# Patient Record
Sex: Male | Born: 1955 | Race: White | Hispanic: No | Marital: Single | Smoking: Current every day smoker
Health system: Southern US, Community
[De-identification: ages and names within clinical notes are randomized; demographics above are authoritative.]

## PROBLEM LIST (undated history)

## (undated) DIAGNOSIS — I714 Abdominal aortic aneurysm, without rupture, unspecified: Secondary | ICD-10-CM

## (undated) DIAGNOSIS — R519 Headache, unspecified: Secondary | ICD-10-CM

## (undated) DIAGNOSIS — F99 Mental disorder, not otherwise specified: Secondary | ICD-10-CM

## (undated) DIAGNOSIS — I951 Orthostatic hypotension: Secondary | ICD-10-CM

## (undated) DIAGNOSIS — U071 COVID-19: Secondary | ICD-10-CM

## (undated) DIAGNOSIS — R0600 Dyspnea, unspecified: Secondary | ICD-10-CM

## (undated) DIAGNOSIS — F419 Anxiety disorder, unspecified: Secondary | ICD-10-CM

## (undated) DIAGNOSIS — F209 Schizophrenia, unspecified: Secondary | ICD-10-CM

## (undated) HISTORY — DX: Abdominal aortic aneurysm, without rupture: I71.4

## (undated) HISTORY — PX: FINGER SURGERY: SHX640

## (undated) HISTORY — DX: Abdominal aortic aneurysm, without rupture, unspecified: I71.40

## (undated) HISTORY — DX: Anxiety disorder, unspecified: F41.9

---

## 1999-01-09 ENCOUNTER — Encounter: Payer: Self-pay | Admitting: Family Medicine

## 1999-01-09 ENCOUNTER — Ambulatory Visit (HOSPITAL_COMMUNITY): Admission: RE | Admit: 1999-01-09 | Discharge: 1999-01-09 | Payer: Self-pay | Admitting: Family Medicine

## 2007-02-16 ENCOUNTER — Emergency Department (HOSPITAL_COMMUNITY): Admission: EM | Admit: 2007-02-16 | Discharge: 2007-02-16 | Payer: Self-pay | Admitting: Emergency Medicine

## 2007-02-18 ENCOUNTER — Emergency Department (HOSPITAL_COMMUNITY): Admission: EM | Admit: 2007-02-18 | Discharge: 2007-02-18 | Payer: Self-pay | Admitting: Family Medicine

## 2007-02-21 ENCOUNTER — Emergency Department (HOSPITAL_COMMUNITY): Admission: EM | Admit: 2007-02-21 | Discharge: 2007-02-21 | Payer: Self-pay | Admitting: Family Medicine

## 2007-02-23 ENCOUNTER — Emergency Department (HOSPITAL_COMMUNITY): Admission: EM | Admit: 2007-02-23 | Discharge: 2007-02-23 | Payer: Self-pay | Admitting: Family Medicine

## 2007-02-26 ENCOUNTER — Emergency Department (HOSPITAL_COMMUNITY): Admission: EM | Admit: 2007-02-26 | Discharge: 2007-02-26 | Payer: Self-pay | Admitting: Family Medicine

## 2008-02-01 ENCOUNTER — Emergency Department (HOSPITAL_COMMUNITY): Admission: EM | Admit: 2008-02-01 | Discharge: 2008-02-01 | Payer: Self-pay | Admitting: Emergency Medicine

## 2008-02-14 ENCOUNTER — Emergency Department (HOSPITAL_COMMUNITY): Admission: EM | Admit: 2008-02-14 | Discharge: 2008-02-14 | Payer: Self-pay | Admitting: Family Medicine

## 2008-06-06 ENCOUNTER — Emergency Department (HOSPITAL_COMMUNITY): Admission: EM | Admit: 2008-06-06 | Discharge: 2008-06-06 | Payer: Self-pay | Admitting: Emergency Medicine

## 2008-06-17 ENCOUNTER — Emergency Department (HOSPITAL_COMMUNITY): Admission: EM | Admit: 2008-06-17 | Discharge: 2008-06-17 | Payer: Self-pay | Admitting: Emergency Medicine

## 2010-03-09 ENCOUNTER — Emergency Department (HOSPITAL_COMMUNITY): Admission: EM | Admit: 2010-03-09 | Discharge: 2010-03-09 | Payer: Self-pay | Admitting: Emergency Medicine

## 2010-05-05 ENCOUNTER — Emergency Department (HOSPITAL_COMMUNITY): Admission: EM | Admit: 2010-05-05 | Discharge: 2010-05-05 | Payer: Self-pay | Admitting: Family Medicine

## 2010-11-02 ENCOUNTER — Emergency Department (HOSPITAL_COMMUNITY): Admission: EM | Admit: 2010-11-02 | Discharge: 2010-11-02 | Payer: Self-pay | Admitting: Emergency Medicine

## 2011-02-17 LAB — CULTURE, ROUTINE-ABSCESS: Culture: NO GROWTH

## 2011-10-13 ENCOUNTER — Emergency Department (INDEPENDENT_AMBULATORY_CARE_PROVIDER_SITE_OTHER)
Admission: EM | Admit: 2011-10-13 | Discharge: 2011-10-13 | Disposition: A | Discharge status: Home / Self Care | Payer: Medicare Other | Source: Home / Self Care

## 2011-10-13 DIAGNOSIS — J209 Acute bronchitis, unspecified: Secondary | ICD-10-CM

## 2011-10-13 HISTORY — DX: Mental disorder, not otherwise specified: F99

## 2011-10-13 MED ORDER — DOXYCYCLINE HYCLATE 100 MG PO CAPS: 100.0000 mg | ORAL_CAPSULE | Freq: Two times a day (BID) | ORAL | Status: AC

## 2011-10-13 NOTE — ED Notes (Signed)
C/o 2 week duration of cold related syx, no relief w ASA and fluphenazine

## 2011-10-13 NOTE — ED Provider Notes (Signed)
History     CSN: [id number] Arrival date & time: 10/13/2011  4:20 PM   None     Chief Complaint  Patient presents with  . URI    (Consider location/radiation/quality/duration/timing/severity/associated sxs/prior treatment) Patient is a 55 y.o. male presenting with cough. The history is provided by the patient.  Cough This is a new problem. Episode onset: 2 weeks. The problem has been gradually worsening. The cough is productive of sputum (thick yellow phlegm). There has been no fever. Associated symptoms include rhinorrhea (only when walking outside at night in the cold air). Pertinent negatives include no chest pain, no chills, no ear congestion, no ear pain, no headaches, no sore throat, no shortness of breath and no wheezing. He has tried nothing for the symptoms. He is a smoker.    Past Medical History  Diagnosis Date  . Psychiatric diagnosis     History reviewed. No pertinent past surgical history.  History reviewed. No pertinent family history.  History  Substance Use Topics  . Smoking status: Current Everyday Smoker -- 2.0 packs/day  . Smokeless tobacco: Not on file  . Alcohol Use: Yes      Review of Systems  Constitutional: Negative for fever, chills and fatigue.  HENT: Positive for rhinorrhea (only when walking outside at night in the cold air). Negative for ear pain, congestion, sore throat, sneezing, postnasal drip and sinus pressure.   Respiratory: Positive for cough. Negative for shortness of breath and wheezing.   Cardiovascular: Negative for chest pain and palpitations.  Neurological: Negative for headaches.    Allergies  Review of patient's allergies indicates no known allergies.  Home Medications   Current Outpatient Rx  Name Route Sig Dispense Refill  . FLUPHENAZINE HCL 10 MG PO TABS Oral Take 10 mg by mouth daily.      Marland Kitchen DOXYCYCLINE HYCLATE 100 MG PO CAPS Oral Take 1 capsule (100 mg total) by mouth 2 (two) times daily. 20 capsule 0    BP  100/65  Pulse 87  Temp(Src) 98.5 F (36.9 C) (Oral)  Resp 20  SpO2 100%  Physical Exam  Nursing note and vitals reviewed. Constitutional: He appears well-developed and well-nourished. No distress.  HENT:  Head: Normocephalic and atraumatic.  Right Ear: Tympanic membrane, external ear and ear canal normal.  Left Ear: Tympanic membrane, external ear and ear canal normal.  Nose: Nose normal.  Mouth/Throat: Uvula is midline, oropharynx is clear and moist and mucous membranes are normal. No oropharyngeal exudate, posterior oropharyngeal edema or posterior oropharyngeal erythema.  Neck: Neck supple.  Cardiovascular: Normal rate, regular rhythm and normal heart sounds.   Pulmonary/Chest: Effort normal and breath sounds normal. No respiratory distress.  Lymphadenopathy:    He has no cervical adenopathy.  Neurological: He is alert.  Skin: Skin is warm and dry.  Psychiatric: He has a normal mood and affect.    ED Course  Procedures (including critical care time)  Labs Reviewed - No data to display No results found.   1. Bronchitis, acute       MDM           Melody Comas, PA 10/13/11 1740

## 2011-10-13 NOTE — ED Notes (Signed)
Called rx for vibramycin 100mg  Po , BID x 10 days, QS, NR at ptp requesst to [facility] in [place] , [place] ; spoke directly c pharmacist

## 2011-12-10 ENCOUNTER — Emergency Department (INDEPENDENT_AMBULATORY_CARE_PROVIDER_SITE_OTHER)
Admission: EM | Admit: 2011-12-10 | Discharge: 2011-12-10 | Disposition: A | Discharge status: Home / Self Care | Payer: Medicare Other | Source: Home / Self Care | Attending: Emergency Medicine | Admitting: Emergency Medicine

## 2011-12-10 ENCOUNTER — Encounter (HOSPITAL_COMMUNITY): Payer: Self-pay

## 2011-12-10 DIAGNOSIS — R21 Rash and other nonspecific skin eruption: Secondary | ICD-10-CM

## 2011-12-10 HISTORY — DX: Schizophrenia, unspecified: F20.9

## 2011-12-10 MED ORDER — TRIAMCINOLONE ACETONIDE 0.1 % EX CREA: TOPICAL_CREAM | Freq: Two times a day (BID) | CUTANEOUS | Status: AC

## 2011-12-10 MED ORDER — HYDROXYZINE HCL 25 MG PO TABS: 25.0000 mg | ORAL_TABLET | Freq: Four times a day (QID) | ORAL | Status: AC

## 2011-12-10 NOTE — ED Provider Notes (Signed)
History     CSN: [id number]  Arrival date & time 12/10/11  1220   First MD Initiated Contact with Patient 12/10/11 1230      Chief Complaint  Patient presents with  . Rash    (Consider location/radiation/quality/duration/timing/severity/associated sxs/prior treatment) HPI Comments: Have had this rash, itchy for 2 weeks, its drying up some now, but still itching , some patches on my upper back are gone now"  Patient is a 56 y.o. male presenting with rash. The history is provided by the patient.  Rash  This is a new problem. Episode onset: 2 weeks. The problem has been gradually improving. There has been no fever. The rash is present on the torso. The pain is at a severity of 1/10. The patient is experiencing no pain. The pain has been constant since onset. Associated symptoms include itching. Pertinent negatives include no blisters and no weeping. He has tried nothing for the symptoms. The treatment provided no relief.    Past Medical History  Diagnosis Date  . Psychiatric diagnosis   . Schizophrenia     History reviewed. No pertinent past surgical history.  No family history on file.  History  Substance Use Topics  . Smoking status: Current Everyday Smoker -- 2.0 packs/day  . Smokeless tobacco: Not on file  . Alcohol Use: Yes     occasional      Review of Systems  Constitutional: Negative for fever, chills and fatigue.  Musculoskeletal: Negative for myalgias and joint swelling.  Skin: Positive for itching and rash.    Allergies  Review of patient's allergies indicates no known allergies.  Home Medications   Current Outpatient Rx  Name Route Sig Dispense Refill  . FLUPHENAZINE HCL 10 MG PO TABS Oral Take 10 mg by mouth daily.      Marland Kitchen HYDROXYZINE HCL 25 MG PO TABS Oral Take 1 tablet (25 mg total) by mouth every 6 (six) hours. 12 tablet 0  . TRIAMCINOLONE ACETONIDE 0.1 % EX CREA Topical Apply topically 2 (two) times daily. Apply twice a day for x 7 days 30 g 0     BP 108/73  Pulse 74  Temp(Src) 97.7 F (36.5 C) (Oral)  Resp 18  SpO2 97%  Physical Exam  Nursing note and vitals reviewed. Constitutional: He appears well-nourished. No distress.  HENT:  Head: Normocephalic.  Pulmonary/Chest:    Skin: Rash noted. Rash is papular. There is erythema.       ED Course  Procedures (including critical care time)  Labs Reviewed - No data to display No results found.   1. Papular eruption       MDM  Papular eruption Let upper Chest wall and shoulder are- resembles dermatome distribution- 40 weeks old- no recent new patches- drying up at this point (have not received Zoster vaccine)        Jimmie Molly, MD 12/10/11 1404

## 2011-12-10 NOTE — ED Notes (Signed)
C/o itchy rash to lt upper chest for 2 weeks.  Denies pain.  States putting tinactin cream on it without improvement. Denies pain or burning

## 2012-10-14 ENCOUNTER — Encounter (HOSPITAL_COMMUNITY): Payer: Self-pay | Admitting: Emergency Medicine

## 2012-10-14 ENCOUNTER — Emergency Department (INDEPENDENT_AMBULATORY_CARE_PROVIDER_SITE_OTHER)
Admission: EM | Admit: 2012-10-14 | Discharge: 2012-10-14 | Disposition: A | Discharge status: Home / Self Care | Payer: Medicare Other | Source: Home / Self Care | Attending: Emergency Medicine | Admitting: Emergency Medicine

## 2012-10-14 DIAGNOSIS — J069 Acute upper respiratory infection, unspecified: Secondary | ICD-10-CM

## 2012-10-14 MED ORDER — SALINE NASAL SPRAY 0.65 % NA SOLN: 1.0000 | NASAL | Status: DC | PRN

## 2012-10-14 MED ORDER — CETIRIZINE-PSEUDOEPHEDRINE ER 5-120 MG PO TB12: 1.0000 | ORAL_TABLET | Freq: Two times a day (BID) | ORAL | Status: DC

## 2012-10-14 NOTE — ED Provider Notes (Signed)
History     CSN: [id number]  Arrival date & time 10/14/12  1343   First MD Initiated Contact with Patient 10/14/12 1624      Chief Complaint  Patient presents with  . URI    (Consider location/radiation/quality/duration/timing/severity/associated sxs/prior treatment) Patient is a 56 y.o. male presenting with URI. The history is provided by the patient.  URI The primary symptoms include headaches, sore throat and cough. The current episode started yesterday. This is a new problem. The problem has been gradually worsening.  The onset of the illness is associated with exposure to sick contacts. Symptoms associated with the illness include plugged ear sensation, sinus pressure, congestion and rhinorrhea. The illness is not associated with chills or facial pain. The following treatments were addressed: Acetaminophen was not tried. A decongestant was not tried. Aspirin was not tried. NSAIDs were not tried.    Past Medical History  Diagnosis Date  . Psychiatric diagnosis   . Schizophrenia     History reviewed. No pertinent past surgical history.  No family history on file.  History  Substance Use Topics  . Smoking status: Current Every Day Smoker -- 2.0 packs/day  . Smokeless tobacco: Not on file  . Alcohol Use: Yes     Comment: occasional      Review of Systems  Constitutional: Negative for chills.  HENT: Positive for congestion, sore throat, rhinorrhea and sinus pressure.   Respiratory: Positive for cough.   Neurological: Positive for headaches.  All other systems reviewed and are negative.    Allergies  Review of patient's allergies indicates no known allergies.  Home Medications   Current Outpatient Rx  Name  Route  Sig  Dispense  Refill  . CETIRIZINE-PSEUDOEPHEDRINE ER 5-120 MG PO TB12   Oral   Take 1 tablet by mouth 2 (two) times daily.   60 tablet   0   . FLUPHENAZINE HCL 10 MG PO TABS   Oral   Take 10 mg by mouth daily.           Marland Kitchen SALINE NASAL  SPRAY 0.65 % NA SOLN   Nasal   Place 1 spray into the nose as needed for congestion.   30 mL   12   . TRIAMCINOLONE ACETONIDE 0.1 % EX CREA   Topical   Apply topically 2 (two) times daily. Apply twice a day for x 7 days   30 g   0     BP 100/69  Pulse 78  Temp 98.1 F (36.7 C) (Oral)  Resp 16  SpO2 100%  Physical Exam  Nursing note and vitals reviewed. Constitutional: He is oriented to person, place, and time. Vital signs are normal. He appears well-developed and well-nourished. He is active and cooperative.  HENT:  Head: Normocephalic.  Right Ear: External ear normal.  Left Ear: External ear normal.  Nose: Nose normal.  Mouth/Throat: Oropharynx is clear and moist. No oropharyngeal exudate.  Eyes: Conjunctivae normal are normal. Pupils are equal, round, and reactive to light. No scleral icterus.  Neck: Trachea normal and normal range of motion. Neck supple.  Cardiovascular: Normal rate, regular rhythm, normal heart sounds and intact distal pulses.   Pulmonary/Chest: Effort normal and breath sounds normal. No respiratory distress.  Lymphadenopathy:    He has no cervical adenopathy.  Neurological: He is alert and oriented to person, place, and time. No cranial nerve deficit or sensory deficit.  Skin: Skin is warm and dry.  Psychiatric: He has a normal mood and affect.  His speech is normal and behavior is normal. Judgment and thought content normal. Cognition and memory are normal.    ED Course  Procedures (including critical care time)  Labs Reviewed - No data to display No results found.   1. URI (upper respiratory infection)       MDM  Increase fluids, rest, medications as prescribed.       Johnsie Kindred, NP 10/14/12 1635

## 2012-10-14 NOTE — ED Notes (Signed)
Patient requested medicines be called in to a particular pharmacy.  Attempted to call in medicines-patient's preferred pharmacy.  This pharmacy was closed-closed at 3 pm on saturdays.  Patient accepted hard copy scripts, declined to have scripts called in to any other pharmacy.

## 2012-10-14 NOTE — ED Notes (Signed)
Reports stuffy head that started yesterday.  Patient reports this usually leads to chest within a week.  No fever, slight headache, throat was a little sore initially

## 2012-10-14 NOTE — ED Provider Notes (Signed)
Medical screening examination/treatment/procedure(s) were performed by non-physician practitioner and as supervising physician I was immediately available for consultation/collaboration.  Leslee Home, M.D.   Reuben Likes, MD 10/14/12 2230

## 2014-04-19 ENCOUNTER — Ambulatory Visit (INDEPENDENT_AMBULATORY_CARE_PROVIDER_SITE_OTHER): Payer: PRIVATE HEALTH INSURANCE | Admitting: Family Medicine

## 2014-04-19 VITALS — BP 124/76 | HR 81 | Temp 98.2°F | Resp 17 | Ht 71.0 in | Wt 145.0 lb

## 2014-04-19 DIAGNOSIS — Z0000 Encounter for general adult medical examination without abnormal findings: Secondary | ICD-10-CM

## 2014-04-19 DIAGNOSIS — F200 Paranoid schizophrenia: Secondary | ICD-10-CM

## 2014-04-19 NOTE — Patient Instructions (Signed)
Good to see you today.  Please continue to follow-up with your regular doctor as usual.  If there is any problem with your forms let us or Dr. Concepcion Elk know.

## 2014-04-19 NOTE — Progress Notes (Signed)
[facility] [address], [place] [address] [phone]  Date:  04/19/2014   Name:  Sean Lowe   DOB:  [birth year]   MRN:  [id number]  PCP:  Dorrene German, MD    Chief Complaint: Annual Exam   History of Present Illness:  Sean Lowe is a 58 y.o. very pleasant male patient who presents with the following:  Here today as a new patient seeking a CPE/ forms for medicaid.  His PCP is at the [facility].    Needs an FL-2 form for his group home/ medicaid serivces.  He resides with [facility] and the staff has already completed his form  He was not able to get in with his PCP today- Dr. Concepcion Elk usually fills out his FL-2.  he does not need anything for me today such as labs or refills  There are no active problems to display for this patient.   Past Medical History  Diagnosis Date  . Psychiatric diagnosis   . Schizophrenia   . Anxiety     History reviewed. No pertinent past surgical history.  History  Substance Use Topics  . Smoking status: Current Every Day Smoker -- 2.00 packs/day for 42 years  . Smokeless tobacco: Not on file  . Alcohol Use: Yes     Comment: occasional    History reviewed. No pertinent family history.  No Known Allergies  Medication list has been reviewed and updated.  Current Outpatient Prescriptions on File Prior to Visit  Medication Sig Dispense Refill  . fluPHENAZine (PROLIXIN) 10 MG tablet Take 10 mg by mouth daily.         No current facility-administered medications on file prior to visit.    Review of Systems:  As per HPI- otherwise negative.   Physical Examination: Filed Vitals:   04/19/14 1255  BP: 124/76  Pulse: 81  Temp: 98.2 F (36.8 C)  Resp: 17   Filed Vitals:   04/19/14 1255  Height: 5\' 11"  (1.803 m)  Weight: 145 lb (65.772 kg)   Body mass index is 20.23 kg/(m^2). Ideal Body Weight: Weight in (lb) to have BMI = 25: 178.9  GEN: WDWN, NAD, Non-toxic, A & O x 3, normal  weight HEENT: Atraumatic, Normocephalic. Neck supple. No masses, No LAD. Ears and Nose: No external deformity. CV: RRR, No M/G/Sean. No JVD. No thrill. No extra heart sounds. PULM: CTA B, no wheezes, crackles, rhonchi. No retractions. No resp. distress. No accessory muscle use. ABD: S, NT, ND, +BS. No rebound. No HSM. EXTR: No c/c/e NEURO Normal gait.  PSYCH: Normally interactive. Conversant. No evidence of psychosis    Assessment and Plan: Physical exam  PE to complete FL-2 form today.  He will let me know if we can do anything else to help   Signed Abbe Amsterdam, MD

## 2015-05-28 ENCOUNTER — Ambulatory Visit (INDEPENDENT_AMBULATORY_CARE_PROVIDER_SITE_OTHER): Payer: Medicaid Other | Admitting: Family Medicine

## 2015-05-28 VITALS — BP 108/56 | HR 78 | Temp 98.1°F | Resp 14 | Ht 71.0 in | Wt 142.6 lb

## 2015-05-28 DIAGNOSIS — F200 Paranoid schizophrenia: Secondary | ICD-10-CM

## 2015-05-28 DIAGNOSIS — Z720 Tobacco use: Secondary | ICD-10-CM

## 2015-05-28 NOTE — Patient Instructions (Signed)
Good to see you today- take care.  Let me know if any problems with your forms

## 2015-05-28 NOTE — Progress Notes (Signed)
[facility] [address], [place] [id number] [phone]  Date:  05/28/2015   Name:  Sean CahillR GLENN Lowe   DOB:  Jul 31, 1956   MRN:  [id number]  PCP:  Dorrene GermanAVBUERE,EDWIN A, MD    Chief Complaint: Forms   History of Present Illness:  Sean Lowe is a 59 y.o. very pleasant male patient who presents with the following:  [facility] and needs his FL-2 form completed  He has schizophrenia but no other medical problems.   He is still on the same medication. He gets his mental health services from [facility]- his treating psychiatrist is variable He does have a PCP but was not able to get in with him for these forms   He takes prolixin total of 7.5 mg daily for his mental illness, also vitamin D and prn tylenol   Patient Active Problem List   Diagnosis Date Noted  . Paranoid schizophrenia 04/19/2014    Past Medical History  Diagnosis Date  . Psychiatric diagnosis   . Schizophrenia   . Anxiety     History reviewed. No pertinent past surgical history.  History  Substance Use Topics  . Smoking status: Current Every Day Smoker -- 2.00 packs/day for 42 years  . Smokeless tobacco: Not on file  . Alcohol Use: Yes     Comment: occasional    History reviewed. No pertinent family history.  No Known Allergies  Medication list has been reviewed and updated.  Current Outpatient Prescriptions on File Prior to Visit  Medication Sig Dispense Refill  . cholecalciferol (VITAMIN D) 1000 UNITS tablet Take 1,000 Units by mouth daily.     No current facility-administered medications on file prior to visit.    Review of Systems:  As per HPI- otherwise negative.   Physical Examination: Filed Vitals:   05/28/15 1115  BP: 108/56  Pulse: 78  Temp: 98.1 F (36.7 C)  Resp: 14   Filed Vitals:   05/28/15 1115  Height: 5\' 11"  (1.803 m)  Weight: 142 lb 9.6 oz (64.683 kg)   Body mass index is 19.9 kg/(m^2). Ideal  Body Weight: Weight in (lb) to have BMI = 25: 178.9  GEN: WDWN, NAD, Non-toxic, A & O x 3, slim, calm affect HEENT: Atraumatic, Normocephalic. Neck supple. No masses, No LAD. Ears and Nose: No external deformity. CV: RRR, No M/G/Sean. No JVD. No thrill. No extra heart sounds. PULM: CTA B, no wheezes, crackles, rhonchi. No retractions. No resp. distress. No accessory muscle use. EXTR: No c/c/e NEURO Normal gait.  PSYCH: Normally interactive. Conversant. Not depressed or anxious appearing.  Calm demeanor.   Referenced FL-2 completed by his PCP in 2014 to complete form for today. See copy in media tab  Assessment and Plan: Paranoid schizophrenia  Tobacco abuse  Completed his FL-2 form for his group home and gave to pt.  He does not need any further assistance from us today  Signed Abbe AmsterdamJessica Copland, MD

## 2015-08-20 DIAGNOSIS — Z0000 Encounter for general adult medical examination without abnormal findings: Secondary | ICD-10-CM | POA: Diagnosis not present

## 2015-08-20 DIAGNOSIS — Z13220 Encounter for screening for lipoid disorders: Secondary | ICD-10-CM | POA: Diagnosis not present

## 2015-08-20 DIAGNOSIS — F17210 Nicotine dependence, cigarettes, uncomplicated: Secondary | ICD-10-CM | POA: Diagnosis not present

## 2015-08-20 DIAGNOSIS — F2089 Other schizophrenia: Secondary | ICD-10-CM | POA: Diagnosis not present

## 2015-08-20 DIAGNOSIS — R7301 Impaired fasting glucose: Secondary | ICD-10-CM | POA: Diagnosis not present

## 2015-08-20 DIAGNOSIS — I714 Abdominal aortic aneurysm, without rupture: Secondary | ICD-10-CM | POA: Diagnosis not present

## 2015-08-20 DIAGNOSIS — Z125 Encounter for screening for malignant neoplasm of prostate: Secondary | ICD-10-CM | POA: Diagnosis not present

## 2015-09-04 DIAGNOSIS — I714 Abdominal aortic aneurysm, without rupture: Secondary | ICD-10-CM | POA: Diagnosis not present

## 2015-09-04 DIAGNOSIS — K760 Fatty (change of) liver, not elsewhere classified: Secondary | ICD-10-CM | POA: Diagnosis not present

## 2015-09-04 DIAGNOSIS — I700 Atherosclerosis of aorta: Secondary | ICD-10-CM | POA: Diagnosis not present

## 2015-11-20 DIAGNOSIS — I714 Abdominal aortic aneurysm, without rupture: Secondary | ICD-10-CM | POA: Diagnosis not present

## 2015-11-20 DIAGNOSIS — F17210 Nicotine dependence, cigarettes, uncomplicated: Secondary | ICD-10-CM | POA: Diagnosis not present

## 2015-11-20 DIAGNOSIS — R7301 Impaired fasting glucose: Secondary | ICD-10-CM | POA: Diagnosis not present

## 2015-11-20 DIAGNOSIS — F2089 Other schizophrenia: Secondary | ICD-10-CM | POA: Diagnosis not present

## 2015-11-20 DIAGNOSIS — Z719 Counseling, unspecified: Secondary | ICD-10-CM | POA: Diagnosis not present

## 2015-12-17 ENCOUNTER — Encounter: Payer: Self-pay | Admitting: Vascular Surgery

## 2015-12-25 ENCOUNTER — Ambulatory Visit (INDEPENDENT_AMBULATORY_CARE_PROVIDER_SITE_OTHER): Payer: Medicare Other | Admitting: Vascular Surgery

## 2015-12-25 ENCOUNTER — Encounter: Payer: Self-pay | Admitting: Vascular Surgery

## 2015-12-25 VITALS — BP 101/69 | HR 85 | Ht 71.0 in | Wt 150.8 lb

## 2015-12-25 DIAGNOSIS — F17210 Nicotine dependence, cigarettes, uncomplicated: Secondary | ICD-10-CM | POA: Diagnosis not present

## 2015-12-25 DIAGNOSIS — I714 Abdominal aortic aneurysm, without rupture, unspecified: Secondary | ICD-10-CM

## 2015-12-25 NOTE — Addendum Note (Signed)
Addended by: Adria Dill L on: 12/25/2015 01:21 PM   Modules accepted: Orders

## 2015-12-25 NOTE — Progress Notes (Signed)
[facility] HISTORY AND PHYSICAL   Referring Physician:  Dr. Concepcion Elk History of Present Illness:  Patient is a 60 y.o. year old male who presents for evaluation of  Abdominal aortic aneurysm. Patient's aneurysm was discovered on a screening ultrasound. This was in October 2016. Aneurysm at that time was 3.8 cm. This was on an ultrasound performed at [facility].  The patient denies any abdominal or back pain. He denies any family history of abdominal aortic aneurysm. He does currently smoke about a pack of cigarettes per day. Rhythm 3 minutes today spent regarding smoking cessation counseling. Other medical problems include schizophrenia and he lives in a group home related to this. He is able to sign his own consent.  Past Medical History  Diagnosis Date  . Psychiatric diagnosis   . Schizophrenia (HCC)   . Anxiety     No past surgical history on file.  Social History Social History  Substance Use Topics  . 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 Smokeless tobacco: None  . Alcohol Use: 0.0 oz/week    0 Standard drinks or equivalent per week     Comment: occasional    Family History No family history on file.  Allergies  No Known Allergies   Current Outpatient Prescriptions  Medication Sig Dispense Refill  . acetaminophen (TYLENOL) 325 MG tablet Take 650 mg by mouth every 4 (four) hours as needed. Reported on 12/25/2015    . cholecalciferol (VITAMIN D) 1000 UNITS tablet Take 50,000 Units by mouth once a week.     . fluPHENAZine (PROLIXIN) 2.5 MG tablet Take 2.5 mg by mouth daily.    . fluPHENAZine (PROLIXIN) 5 MG tablet Take 5 mg by mouth daily.     No current facility-administered medications for this visit.    ROS:   General:  No weight loss, Fever, chills  HEENT: No recent headaches, no nasal bleeding, no visual changes, no sore throat  Neurologic: No dizziness, blackouts,  seizures. No recent symptoms of stroke or mini- stroke. No recent episodes of slurred speech, or temporary blindness.  Cardiac: No recent episodes of chest pain/pressure, no shortness of breath at rest.  No shortness of breath with exertion.  Denies history of atrial fibrillation or irregular heartbeat  Vascular: No history of rest pain in feet.  No history of claudication.  No history of non-healing ulcer, No history of DVT   Pulmonary: No home oxygen, no productive cough, no hemoptysis,  No asthma or wheezing  Musculoskeletal:   Arthritis,  Low back pain,   Joint pain  Hematologic:No history of hypercoagulable state.  No history of easy bleeding.  No history of anemia  Gastrointestinal: No hematochezia or melena,  No gastroesophageal reflux, no trouble swallowing  Urinary:  chronic Kidney disease,  on HD -  MWF or  TTHS,  Burning with urination,  Frequent urination,  Difficulty urinating;   Skin: No rashes  Psychological: No history of anxiety,  No history of depression   Physical Examination  Filed Vitals:   12/25/15 1018  BP: 101/69  Pulse: 85  Height:  (1.803 m)  Weight: 150 lb 12.8 oz (68.402 kg)  SpO2: 98%    Body mass index is 21.04 kg/(m^2).  General:  Alert and oriented, no acute distress HEENT: Normal Neck: No bruit or JVD Pulmonary: Clear to auscultation bilaterally Cardiac:  Regular Rate and Rhythm without murmur Abdomen: Soft, non-tender, non-distended,  easily palpable pulsatile epigastric mass consistent with 4 cm abdominal aortic aneurysm nontender Skin: No rash Extremity Pulses:  2+ radial, brachial, femoral, absent bilateral dorsalis pedis,  2+ posterior tibial pulses bilaterally Musculoskeletal: No deformity or edema  Neurologic: Upper and lower extremity motor 5/5 and symmetric  DATA:   I reviewed the patient's ultrasound which shows a 3.8 x 3.7 centimeter abdominal aortic aneurysm  ASSESSMENT:   Small abdominal  aortic aneurysm, asymptomatic   PLAN:   Follow-up ultrasound in 3 months which would be 6 months from his last ultrasound. Patient was advised today on signs and symptoms of ruptured aneurysm  And that he should inform anyone in the emergency room if he has severe back or abdominal pain that he has a known abdominal aortic aneurysm. Would consider repair if she reaches 5-5-1/2 cm in diameter. Patient advised to quit smoking.  Fabienne Bruns, MD [facility] Office: [phone] Pager: [phone]

## 2016-03-26 ENCOUNTER — Encounter: Payer: Self-pay | Admitting: Family

## 2016-04-01 ENCOUNTER — Ambulatory Visit (HOSPITAL_COMMUNITY)
Admission: RE | Admit: 2016-04-01 | Discharge: 2016-04-01 | Disposition: A | Discharge status: Home / Self Care | Payer: Medicare Other | Source: Ambulatory Visit | Attending: Family | Admitting: Family

## 2016-04-01 ENCOUNTER — Ambulatory Visit (INDEPENDENT_AMBULATORY_CARE_PROVIDER_SITE_OTHER): Payer: Medicare Other | Admitting: Family

## 2016-04-01 ENCOUNTER — Encounter: Payer: Self-pay | Admitting: Family

## 2016-04-01 VITALS — BP 111/76 | HR 58 | Temp 96.9°F | Resp 14 | Ht 70.0 in | Wt 147.0 lb

## 2016-04-01 DIAGNOSIS — Z720 Tobacco use: Secondary | ICD-10-CM

## 2016-04-01 DIAGNOSIS — I714 Abdominal aortic aneurysm, without rupture, unspecified: Secondary | ICD-10-CM

## 2016-04-01 DIAGNOSIS — F17200 Nicotine dependence, unspecified, uncomplicated: Secondary | ICD-10-CM

## 2016-04-01 NOTE — Progress Notes (Signed)
[facility] [place]  Established Abdominal Aortic Aneurysm  History of Present Illness  Sean Lowe is a 60 y.o. ([birth year]) male patient of Dr. Darrick Penna who presents for evaluation ofabdominal aortic aneurysm. Patient's aneurysm was discovered on a screening ultrasound. This was in October 2016. Aneurysm at that time was 3.8 cm. This was on an ultrasound performed at [facility]. The patient denies any abdominal or back pain. He denies any family history of abdominal aortic aneurysm.   Other medical problems include schizophrenia and he lives in a group home related to this. He is able to sign his own consent.  The patient no claudication in legs with walking. He walks an hour. The patient no history of stroke or TIA symptoms.  Pt Diabetic: No Pt smoker: smoker  (2 ppd, started at age 89 yrs)   Past Medical History  Diagnosis Date  . Psychiatric diagnosis   . Schizophrenia (HCC)   . Anxiety    Past Surgical History  Procedure Laterality Date  . Finger surgery Left     repair from injury   Social History Social History   Social History  . Marital Status: Single    Spouse Name: N/A  . Number of Children: N/A  . Years of Education: N/A   Occupational History  . Not on file.   Social History Main Topics  . 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 Smokeless tobacco: Not on file  . Alcohol Use: 0.0 oz/week    0 Standard drinks or equivalent per week     Comment: occasional  . Drug Use: No  . Sexual Activity: No   Other Topics Concern  . Not on file   Social History Narrative   Family History No family history on file.  Current Outpatient Prescriptions on File Prior to Visit  Medication Sig Dispense Refill  . acetaminophen (TYLENOL) 325 MG tablet Take 650 mg by mouth every 4 (four) hours as needed. Reported on 12/25/2015    . cholecalciferol (VITAMIN D) 1000 UNITS tablet Take 50,000  Units by mouth once a week.     . fluPHENAZine (PROLIXIN) 2.5 MG tablet Take 2.5 mg by mouth daily.    . fluPHENAZine (PROLIXIN) 5 MG tablet Take 5 mg by mouth daily.     No current facility-administered medications on file prior to visit.   No Known Allergies  ROS: See HPI for pertinent positives and negatives.  Physical Examination  Filed Vitals:   04/01/16 0948  BP: 111/76  Pulse: 58  Temp: 96.9 F (36.1 C)  TempSrc: Oral  Resp: 14  Height:  (1.778 m)  Weight: 147 lb (66.679 kg)  SpO2: 66%   Body mass index is 21.09 kg/(m^2).   General: Alert and oriented, no acute distress HEENT: Normal Neck: No bruit or JVD Pulmonary: Clear to auscultation bilaterally Cardiac: Regular Rate and Rhythm without murmur Abdomen: Soft, non-tender, non-distended, easily palpable pulsatile epigastric mass consistent with 4 cm abdominal aortic aneurysm nontender Skin: No rash Extremity Pulses: 2+ radial, brachial, femoral, absent bilateral dorsalis pedis, 2+ posterior tibial pulses bilaterally Musculoskeletal: No deformity or edema Neurologic: Upper and lower extremity motor 5/5 and symmetric     Non-Invasive Vascular Imaging  AAA Duplex (04/01/2016) ABDOMINAL AORTA DUPLEX EVALUATION    INDICATION: Evaluation of known abdominal aortic aneurysm per outside facility, [facility] 09/04/2015 AAA measuring 3.8 cm x 3.7 cm.  PREVIOUS INTERVENTION(S):     DUPLEX EXAM:     LOCATION DIAMETER AP (cm) DIAMETER TRANSVERSE (cm) VELOCITIES (cm/sec)  Aorta Proximal 2.1 2.2 63  Aorta Mid 2.8 2.6 73  Aorta Distal 3.7 3.7 50  Right Common Iliac Artery 1.0 1.0 127  Left Common Iliac Artery 0.9 0.9 102    Previous max aortic diameter:   Date:      ADDITIONAL FINDINGS:     IMPRESSION: Fusiform abdominal aortic aneurysm measuring cm 3.7 AP x 3.7 cm transverse in the distal segment. It is noted that the mid segment demonstrates nonaneurysmal dilitaton.  Mild atherosclerotic plaque is  noted with no hemodynamically significant stenosis identified.    Compared to the previous exam:  There are no previous exams performed at our facility for comparison.     Medical Decision Making  The patient is a 60 y.o. male who presents with asymptomatic AAA with no increase in size. The patient was counseled re smoking cessation and given several free resources re smoking cessation. The patient placed the above pamphlets in a trash can in the exam room.   Based on this patient's exam and diagnostic studies, the patient will follow up in 1 year  with the following studies: AAA duplex.  Consideration for repair of AAA would be made when the size is 5.5 cm, growth > 1 cm/yr, and symptomatic status.  I emphasized the importance of maximal medical management including strict control of blood pressure, blood glucose, and lipid levels, antiplatelet agents, obtaining regular exercise, and cessation of smoking.   The patient was given information about AAA including signs, symptoms, treatment, and how to minimize the risk of enlargement and rupture of aneurysms.    The patient was advised to call 911 should the patient experience sudden onset abdominal or back pain.   Thank you for allowing us to participate in this patient's care.  Charisse MarchSuzanne Bayan Kushnir, RN, MSN, FNP-C [facility] [place] Office: [phone]  Clinic Physician: Darrick PennaFields  04/01/2016, 10:00 AM

## 2016-04-01 NOTE — Patient Instructions (Addendum)
Abdominal Aortic Aneurysm An aneurysm is a weakened or damaged part of an artery wall that bulges from the normal force of blood pumping through the body. An abdominal aortic aneurysm is an aneurysm that occurs in the lower part of the aorta, the main artery of the body.  The major concern with an abdominal aortic aneurysm is that it can enlarge and burst (rupture) or blood can flow between the layers of the wall of the aorta through a tear (aorticdissection). Both of these conditions can cause bleeding inside the body and can be life threatening unless diagnosed and treated promptly. CAUSES  The exact cause of an abdominal aortic aneurysm is unknown. Some contributing factors are:   A hardening of the arteries caused by the buildup of fat and other substances in the lining of a blood vessel (arteriosclerosis).  Inflammation of the walls of an artery (arteritis).   Connective tissue diseases, such as Marfan syndrome.   Abdominal trauma.   An infection, such as syphilis or staphylococcus, in the wall of the aorta (infectious aortitis) caused by bacteria. RISK FACTORS  Risk factors that contribute to an abdominal aortic aneurysm may include:  Age older than 60 years.   High blood pressure (hypertension).  Male gender.  Ethnicity (white race).  Obesity.  Family history of aneurysm (first degree relatives only).  Tobacco use. PREVENTION  The following healthy lifestyle habits may help decrease your risk of abdominal aortic aneurysm:  Quitting smoking. Smoking can raise your blood pressure and cause arteriosclerosis.  Limiting or avoiding alcohol.  Keeping your blood pressure, blood sugar level, and cholesterol levels within normal limits.  Decreasing your salt intake. In somepeople, too much salt can raise blood pressure and increase your risk of abdominal aortic aneurysm.  Eating a diet low in saturated fats and cholesterol.  Increasing your fiber intake by including  whole grains, vegetables, and fruits in your diet. Eating these foods may help lower blood pressure.  Maintaining a healthy weight.  Staying physically active and exercising regularly. SYMPTOMS  The symptoms of abdominal aortic aneurysm may vary depending on the size and rate of growth of the aneurysm.Most grow slowly and do not have any symptoms. When symptoms do occur, they may include:  Pain (abdomen, side, lower back, or groin). The pain may vary in intensity. A sudden onset of severe pain may indicate that the aneurysm has ruptured.  Feeling full after eating only small amounts of food.  Nausea or vomiting or both.  Feeling a pulsating lump in the abdomen.  Feeling faint or passing out. DIAGNOSIS  Since most unruptured abdominal aortic aneurysms have no symptoms, they are often discovered during diagnostic exams for other conditions. An aneurysm may be found during the following procedures:  Ultrasonography (A one-time screening for abdominal aortic aneurysm by ultrasonography is also recommended for all men aged 65-75 years who have ever smoked).  X-ray exams.  A computed tomography (CT).  Magnetic resonance imaging (MRI).  Angiography or arteriography. TREATMENT  Treatment of an abdominal aortic aneurysm depends on the size of your aneurysm, your age, and risk factors for rupture. Medication to control blood pressure and pain may be used to manage aneurysms smaller than 6 cm. Regular monitoring for enlargement may be recommended by your caregiver if:  The aneurysm is 3-4 cm in size (an annual ultrasonography may be recommended).  The aneurysm is 4-4.5 cm in size (an ultrasonography every 6 months may be recommended).  The aneurysm is larger than 4.5 cm in   size (your caregiver may ask that you be examined by a vascular surgeon). If your aneurysm is larger than 6 cm, surgical repair may be recommended. There are two main methods for repair of an aneurysm:   Endovascular  repair (a minimally invasive surgery). This is done most often.  Open repair. This method is used if an endovascular repair is not possible.   This information is not intended to replace advice given to you by your health care provider. Make sure you discuss any questions you have with your health care provider.   Document Released: 08/25/2005 Document Revised: 03/12/2013 Document Reviewed: 12/15/2012 Elsevier Interactive Patient Education 2016 Elsevier Inc.     Steps to Quit Smoking  Smoking tobacco can be harmful to your health and can affect almost every organ in your body. Smoking puts you, and those around you, at risk for developing many serious chronic diseases. Quitting smoking is difficult, but it is one of the best things that you can do for your health. It is never too late to quit. WHAT ARE THE BENEFITS OF QUITTING SMOKING? When you quit smoking, you lower your risk of developing serious diseases and conditions, such as:  Lung cancer or lung disease, such as COPD.  Heart disease.  Stroke.  Heart attack.  Infertility.  Osteoporosis and bone fractures. Additionally, symptoms such as coughing, wheezing, and shortness of breath may get better when you quit. You may also find that you get sick less often because your body is stronger at fighting off colds and infections. If you are pregnant, quitting smoking can help to reduce your chances of having a baby of low birth weight. HOW DO I GET READY TO QUIT? When you decide to quit smoking, create a plan to make sure that you are successful. Before you quit:  Pick a date to quit. Set a date within the next two weeks to give you time to prepare.  Write down the reasons why you are quitting. Keep this list in places where you will see it often, such as on your bathroom mirror or in your car or wallet.  Identify the people, places, things, and activities that make you want to smoke (triggers) and avoid them. Make sure to take  these actions:  Throw away all cigarettes at home, at work, and in your car.  Throw away smoking accessories, such as ashtrays and lighters.  Clean your car and make sure to empty the ashtray.  Clean your home, including curtains and carpets.  Tell your family, friends, and coworkers that you are quitting. Support from your loved ones can make quitting easier.  Talk with your health care provider about your options for quitting smoking.  Find out what treatment options are covered by your health insurance. WHAT STRATEGIES CAN I USE TO QUIT SMOKING?  Talk with your healthcare provider about different strategies to quit smoking. Some strategies include:  Quitting smoking altogether instead of gradually lessening how much you smoke over a period of time. Research shows that quitting "cold turkey" is more successful than gradually quitting.  Attending in-person counseling to help you build problem-solving skills. You are more likely to have success in quitting if you attend several counseling sessions. Even short sessions of 10 minutes can be effective.  Finding resources and support systems that can help you to quit smoking and remain smoke-free after you quit. These resources are most helpful when you use them often. They can include:  Online chats with a counselor.  Telephone   quitlines.  Printed self-help materials.  Support groups or group counseling.  Text messaging programs.  Mobile phone applications.  Taking medicines to help you quit smoking. (If you are pregnant or breastfeeding, talk with your health care provider first.) Some medicines contain nicotine and some do not. Both types of medicines help with cravings, but the medicines that include nicotine help to relieve withdrawal symptoms. Your health care provider may recommend:  Nicotine patches, gum, or lozenges.  Nicotine inhalers or sprays.  Non-nicotine medicine that is taken by mouth. Talk with your health care  provider about combining strategies, such as taking medicines while you are also receiving in-person counseling. Using these two strategies together makes you more likely to succeed in quitting than if you used either strategy on its own. If you are pregnant or breastfeeding, talk with your health care provider about finding counseling or other support strategies to quit smoking. Do not take medicine to help you quit smoking unless told to do so by your health care provider. WHAT THINGS CAN I DO TO MAKE IT EASIER TO QUIT? Quitting smoking might feel overwhelming at first, but there is a lot that you can do to make it easier. Take these important actions:  Reach out to your family and friends and ask that they support and encourage you during this time. Call telephone quitlines, reach out to support groups, or work with a counselor for support.  Ask people who smoke to avoid smoking around you.  Avoid places that trigger you to smoke, such as bars, parties, or smoke-break areas at work.  Spend time around people who do not smoke.  Lessen stress in your life, because stress can be a smoking trigger for some people. To lessen stress, try:  Exercising regularly.  Deep-breathing exercises.  Yoga.  Meditating.  Performing a body scan. This involves closing your eyes, scanning your body from head to toe, and noticing which parts of your body are particularly tense. Purposefully relax the muscles in those areas.  Download or purchase mobile phone or tablet apps (applications) that can help you stick to your quit plan by providing reminders, tips, and encouragement. There are many free apps, such as QuitGuide from the CDC (Centers for Disease Control and Prevention). You can find other support for quitting smoking (smoking cessation) through smokefree.gov and other websites. HOW WILL I FEEL WHEN I QUIT SMOKING? Within the first 24 hours of quitting smoking, you may start to feel some withdrawal  symptoms. These symptoms are usually most noticeable 2-3 days after quitting, but they usually do not last beyond 2-3 weeks. Changes or symptoms that you might experience include:  Mood swings.  Restlessness, anxiety, or irritation.  Difficulty concentrating.  Dizziness.  Strong cravings for sugary foods in addition to nicotine.  Mild weight gain.  Constipation.  Nausea.  Coughing or a sore throat.  Changes in how your medicines work in your body.  A depressed mood.  Difficulty sleeping (insomnia). After the first 2-3 weeks of quitting, you may start to notice more positive results, such as:  Improved sense of smell and taste.  Decreased coughing and sore throat.  Slower heart rate.  Lower blood pressure.  Clearer skin.  The ability to breathe more easily.  Fewer sick days. Quitting smoking is very challenging for most people. Do not get discouraged if you are not successful the first time. Some people need to make many attempts to quit before they achieve long-term success. Do your best to stick to   your quit plan, and talk with your health care provider if you have any questions or concerns.   This information is not intended to replace advice given to you by your health care provider. Make sure you discuss any questions you have with your health care provider.   Document Released: 11/09/2001 Document Revised: 04/01/2015 Document Reviewed: 04/01/2015 Elsevier Interactive Patient Education 2016 Elsevier Inc.  

## 2016-05-17 NOTE — Addendum Note (Signed)
Addended by: Sharee PimpleMCCHESNEY, MARILYN K on: 05/17/2016 05:04 PM   Modules accepted: Orders

## 2017-04-11 ENCOUNTER — Encounter: Payer: Self-pay | Admitting: Family

## 2017-04-14 ENCOUNTER — Ambulatory Visit (INDEPENDENT_AMBULATORY_CARE_PROVIDER_SITE_OTHER): Payer: Medicare Other | Admitting: Family

## 2017-04-14 ENCOUNTER — Other Ambulatory Visit (HOSPITAL_COMMUNITY): Payer: Self-pay

## 2017-04-14 ENCOUNTER — Encounter: Payer: Self-pay | Admitting: Family

## 2017-04-14 ENCOUNTER — Ambulatory Visit: Payer: Medicare Other | Admitting: Family

## 2017-04-14 ENCOUNTER — Ambulatory Visit (HOSPITAL_COMMUNITY)
Admission: RE | Admit: 2017-04-14 | Discharge: 2017-04-14 | Disposition: A | Discharge status: Home / Self Care | Payer: Medicare Other | Source: Ambulatory Visit | Attending: Family | Admitting: Family

## 2017-04-14 VITALS — BP 91/63 | HR 73 | Resp 16 | Ht 71.0 in | Wt 140.0 lb

## 2017-04-14 DIAGNOSIS — I714 Abdominal aortic aneurysm, without rupture, unspecified: Secondary | ICD-10-CM

## 2017-04-14 DIAGNOSIS — F17200 Nicotine dependence, unspecified, uncomplicated: Secondary | ICD-10-CM | POA: Insufficient documentation

## 2017-04-14 NOTE — Patient Instructions (Addendum)
Abdominal Aortic Aneurysm Blood pumps away from the heart through tubes (blood vessels) called arteries. Aneurysms are weak or damaged places in the wall of an artery. It bulges out like a balloon. An abdominal aortic aneurysm happens in the main artery of the body (aorta). It can burst or tear, causing bleeding inside the body. This is an emergency. It needs treatment right away. What are the causes? The exact cause is unknown. Things that could cause this problem include:  Fat and other substances building up in the lining of a tube.  Swelling of the walls of a blood vessel.  Certain tissue diseases.  Belly (abdominal) trauma.  An infection in the main artery of the body. What increases the risk? There are things that make it more likely for you to have an aneurysm. These include:  Being over the age of 63 years old.  Having high blood pressure (hypertension).  Being a male.  Being white.  Being very overweight (obese).  Having a family history of aneurysm.  Using tobacco products. What are the signs or symptoms? Symptoms depend on the size of the aneurysm and how fast it grows. There may not be symptoms. If symptoms occur, they can include:  Pain (belly, side, lower back, or groin).  Feeling full after eating a small amount of food.  Feeling sick to your stomach (nauseous), throwing up (vomiting), or both.  Feeling a lump in your belly that feels like it is beating (pulsating).  Feeling like you will pass out (faint). How is this treated?  Medicine to control blood pressure and pain.  Imaging tests to see if the aneurysm gets bigger.  Surgery. How is this prevented? To lessen your chance of getting this condition:  Stop smoking. Stop chewing tobacco.  Limit or avoid alcohol.  Keep your blood pressure, blood sugar, and cholesterol within normal limits.  Eat less salt.  Eat foods low in saturated fats and cholesterol. These are found in animal and whole  dairy products.  Eat more fiber. Fiber is found in whole grains, vegetables, and fruits.  Keep a healthy weight.  Stay active and exercise often. This information is not intended to replace advice given to you by your health care provider. Make sure you discuss any questions you have with your health care provider. Document Released: 03/12/2013 Document Revised: 04/22/2016 Document Reviewed: 12/15/2012 Elsevier Interactive Patient Education  2017 Elsevier Inc.      Steps to Quit Smoking Smoking tobacco can be bad for your health. It can also affect almost every organ in your body. Smoking puts you and people around you at risk for many serious long-lasting (chronic) diseases. Quitting smoking is hard, but it is one of the best things that you can do for your health. It is never too late to quit. What are the benefits of quitting smoking? When you quit smoking, you lower your risk for getting serious diseases and conditions. They can include:  Lung cancer or lung disease.  Heart disease.  Stroke.  Heart attack.  Not being able to have children (infertility).  Weak bones (osteoporosis) and broken bones (fractures). If you have coughing, wheezing, and shortness of breath, those symptoms may get better when you quit. You may also get sick less often. If you are pregnant, quitting smoking can help to lower your chances of having a baby of low birth weight. What can I do to help me quit smoking? Talk with your doctor about what can help you quit smoking. Some   things you can do (strategies) include:  Quitting smoking totally, instead of slowly cutting back how much you smoke over a period of time.  Going to in-person counseling. You are more likely to quit if you go to many counseling sessions.  Using resources and support systems, such as:  Online chats with a counselor.  Phone quitlines.  Printed self-help materials.  Support groups or group counseling.  Text messaging  programs.  Mobile phone apps or applications.  Taking medicines. Some of these medicines may have nicotine in them. If you are pregnant or breastfeeding, do not take any medicines to quit smoking unless your doctor says it is okay. Talk with your doctor about counseling or other things that can help you. Talk with your doctor about using more than one strategy at the same time, such as taking medicines while you are also going to in-person counseling. This can help make quitting easier. What things can I do to make it easier to quit? Quitting smoking might feel very hard at first, but there is a lot that you can do to make it easier. Take these steps:  Talk to your family and friends. Ask them to support and encourage you.  Call phone quitlines, reach out to support groups, or work with a counselor.  Ask people who smoke to not smoke around you.  Avoid places that make you want (trigger) to smoke, such as:  Bars.  Parties.  Smoke-break areas at work.  Spend time with people who do not smoke.  Lower the stress in your life. Stress can make you want to smoke. Try these things to help your stress:  Getting regular exercise.  Deep-breathing exercises.  Yoga.  Meditating.  Doing a body scan. To do this, close your eyes, focus on one area of your body at a time from head to toe, and notice which parts of your body are tense. Try to relax the muscles in those areas.  Download or buy apps on your mobile phone or tablet that can help you stick to your quit plan. There are many free apps, such as QuitGuide from the CDC (Centers for Disease Control and Prevention). You can find more support from smokefree.gov and other websites. This information is not intended to replace advice given to you by your health care provider. Make sure you discuss any questions you have with your health care provider. Document Released: 09/11/2009 Document Revised: 07/13/2016 Document Reviewed:  04/01/2015 Elsevier Interactive Patient Education  2017 Elsevier Inc.  

## 2017-04-14 NOTE — Progress Notes (Signed)
[facility] [place]   CC: Follow up Abdominal Aortic Aneurysm  History of Present Illness  Sean Lowe is a 61 y.o. (26-Jan-1956) male patient of Dr. Darrick Penna who presents for evaluation ofabdominal aortic aneurysm. Patient's aneurysm was discovered on a screening ultrasound. This was in October 2016. Aneurysm at that time was 3.8 cm. This was on an ultrasound performed at [facility]. The patient denies any abdominal or back pain. He denies any family history of abdominal aortic aneurysm.   Other medical problems include schizophrenia and he lives in a group home related to this. He is able to sign his own consent.  The patient no claudication in legs with walking. He walks an hour or more daily. The patient no history of stroke or TIA symptoms.  Pt Diabetic: No Pt smoker: smoker  (1-2 ppd, started at age 69 yrs)   Past Medical History:  Diagnosis Date  . AAA (abdominal aortic aneurysm) (HCC)   . Anxiety   . Psychiatric diagnosis   . Schizophrenia [facility])    Past Surgical History:  Procedure Laterality Date  . FINGER SURGERY Left    repair from injury   Social History Social History   Social History  . Marital status: Single    Spouse name: N/A  . Number of children: N/A  . Years of education: N/A   Occupational History  . Not on file.   Social History Main Topics  . Smoking status: Current Every Day Smoker    Packs/day: 2.00    Years: 42.00    Types: Cigarettes  . Smokeless tobacco: Never Used  . Alcohol use 0.0 oz/week     Comment: occasional  . Drug use: No  . Sexual activity: No   Other Topics Concern  . Not on file   Social History Narrative  . No narrative on file   Family History History reviewed. No pertinent family history.  Current Outpatient Prescriptions on File Prior to Visit  Medication Sig Dispense Refill  . acetaminophen (TYLENOL) 325 MG tablet Take 650 mg by mouth every 4 (four) hours as needed. Reported on  12/25/2015    . cholecalciferol (VITAMIN D) 1000 UNITS tablet Take 50,000 Units by mouth once a week.     . fluPHENAZine (PROLIXIN) 2.5 MG tablet Take 2.5 mg by mouth daily.    . fluPHENAZine (PROLIXIN) 5 MG tablet Take 5 mg by mouth daily.     No current facility-administered medications on file prior to visit.    No Known Allergies  ROS: See HPI for pertinent positives and negatives.  Physical Examination  Vitals:   04/14/17 0843  BP: 91/63  Pulse: 73  Resp: 16  SpO2: 99%  Weight: 140 lb (63.5 kg)  Height: 5\' 11"  (1.803 m)   Body mass index is 19.53 kg/m.  General:  Alert and oriented, no acute distress HEENT: Normal, PERRLA Neck: No bruit or JVD Pulmonary: Clear to auscultation bilaterally, respirations are non labored. Cardiac: Regular Rate and Rhythm without appreciable murmur Abdomen: Soft, non-tender, non-distended, easily palpable pulsatile epigastric mass consistent with 4 cm abdominal aortic aneurysm nontender Skin: No rash Extremity Pulses: 2+ radial, brachial, femoral, absent bilateral dorsalis pedis, 1+ posterior tibial pulses bilaterally Musculoskeletal: No deformity or edema Neurologic: Upper and lower extremity motor 5/5 and symmetric.   Non-Invasive Vascular Imaging  AAA Duplex (04/14/2017)  Previous size: 3.7 cm (Date: 04-01-16)  Current size:  4.3 cm (Date: 04-14-17); Right CIA: 1.1 cm; Left CIA: 1.1 cm  Medical Decision Making  The patient is a 61 y.o. male who presents with asymptomatic AAA with increase in size from 3.7 cm to 4.3 cm in a year. The patient was counseled re smoking cessation and given several free resources re smoking cessation.    Based on this patient's exam and diagnostic studies, the patient will follow up in 1 year  with the following studies: AAA duplex.  Consideration for repair of AAA would be made when the size is 5.0 cm, growth > 1 cm/yr, and symptomatic status.  I emphasized the importance of maximal medical  management including strict control of blood pressure, blood glucose, and lipid levels, antiplatelet agents, obtaining regular exercise, and cessation of smoking.   The patient was given information about AAA including signs, symptoms, treatment, and how to minimize the risk of enlargement and rupture of aneurysms.    The patient was advised to call 911 should the patient experience sudden onset abdominal or back pain.   Thank you for allowing us to participate in this patient's care.  Charisse MarchSuzanne Correy Weidner, RN, MSN, FNP-C [facility] [place] Office: [phone]  Clinic Physician: Darrick PennaFields  04/14/2017, 8:50 AM

## 2017-04-20 NOTE — Addendum Note (Signed)
Addended by: Burton ApleyPETTY, Makynzee Tigges A on: 04/20/2017 11:21 AM   Modules accepted: Orders

## 2017-10-03 ENCOUNTER — Ambulatory Visit (HOSPITAL_COMMUNITY)
Admission: EM | Admit: 2017-10-03 | Discharge: 2017-10-03 | Discharge status: Against Medical Advice | Payer: Medicare Other

## 2017-10-03 NOTE — ED Notes (Signed)
Called x 2 no answer

## 2017-10-04 ENCOUNTER — Emergency Department (HOSPITAL_COMMUNITY)
Admission: EM | Admit: 2017-10-04 | Discharge: 2017-10-04 | Disposition: A | Discharge status: Home / Self Care | Payer: Medicare Other | Attending: Emergency Medicine | Admitting: Emergency Medicine

## 2017-10-04 DIAGNOSIS — L02416 Cutaneous abscess of left lower limb: Secondary | ICD-10-CM | POA: Insufficient documentation

## 2017-10-04 DIAGNOSIS — Z79899 Other long term (current) drug therapy: Secondary | ICD-10-CM | POA: Diagnosis not present

## 2017-10-04 DIAGNOSIS — F17210 Nicotine dependence, cigarettes, uncomplicated: Secondary | ICD-10-CM | POA: Insufficient documentation

## 2017-10-04 DIAGNOSIS — L0291 Cutaneous abscess, unspecified: Secondary | ICD-10-CM

## 2017-10-04 DIAGNOSIS — R2242 Localized swelling, mass and lump, left lower limb: Secondary | ICD-10-CM | POA: Diagnosis present

## 2017-10-04 MED ORDER — LIDOCAINE-EPINEPHRINE (PF) 2 %-1:200000 IJ SOLN
10.0000 mL | Freq: Once | INTRAMUSCULAR | Status: AC
  Administered 2017-10-04: 10 mL via INTRADERMAL
  Filled 2017-10-04: qty 20

## 2017-10-04 NOTE — ED Provider Notes (Signed)
[facility]-EMERGENCY DEPT Provider Note   CSN: [id number] Arrival date & time: 10/04/17  1147     History   Chief Complaint Chief Complaint  Patient presents with  . Wound Infection    HPI Sean Lowe is a 61 y.o. male presents today with chief complaint of gradual onset, progressively worsening area of redness to his right shin.  Patient states that he has had a "small bump"to his right shin 3 years, but progressively worsened over the past 2-3 weeks.  He notes the area has gotten larger and has been draining yellow-red fluid.  He states the area is not particularly tender or painful.  He denies fevers, chills, numbness, weakness, or any other symptoms at this time.  The history is provided by the patient.    Past Medical History:  Diagnosis Date  . AAA (abdominal aortic aneurysm) (HCC)   . Anxiety   . Psychiatric diagnosis   . Schizophrenia [facility](HCC)     Patient Active Problem List   Diagnosis Date Noted  . AAA (abdominal aortic aneurysm) without rupture (HCC) 12/25/2015  . Paranoid schizophrenia (HCC) 04/19/2014    Past Surgical History:  Procedure Laterality Date  . FINGER SURGERY Left    repair from injury       Home Medications    Prior to Admission medications   Medication Sig Start Date End Date Taking? Authorizing Provider  acetaminophen (TYLENOL) 325 MG tablet Take 650 mg by mouth every 4 (four) hours as needed. Reported on 12/25/2015    [provider]  cholecalciferol (VITAMIN D) 1000 UNITS tablet Take 50,000 Units by mouth once a week.     [provider]  fluPHENAZine (PROLIXIN) 2.5 MG tablet Take 2.5 mg by mouth daily.    [provider]  fluPHENAZine (PROLIXIN) 5 MG tablet Take 5 mg by mouth daily.    [provider]    Family History No family history on file.  Social History Social History   Tobacco Use  . Smoking status: Current Every Day Smoker    Packs/day: 2.00    Years:  42.00    Pack years: 84.00    Types: Cigarettes  . Smokeless tobacco: Never Used  Substance Use Topics  . Alcohol use: Yes    Alcohol/week: 0.0 oz    Comment: occasional  . Drug use: No     Allergies   Patient has no known allergies.   Review of Systems Review of Systems  Constitutional: Negative for chills and fever.  Skin: Positive for color change.     Physical Exam Updated Vital Signs BP 106/71 (BP Location: Left Arm)   Pulse 83   Temp (!) 97.5 F (36.4 C) (Oral)   Resp 16   SpO2 99%   Physical Exam  Constitutional: He appears well-developed and well-nourished. No distress.  HENT:  Head: Normocephalic and atraumatic.  Eyes: Conjunctivae are normal. Right eye exhibits no discharge. Left eye exhibits no discharge.  Neck: No JVD present. No tracheal deviation present.  Cardiovascular: Normal rate.  Pulmonary/Chest: Effort normal.  Abdominal: He exhibits no distension.  Musculoskeletal: Normal range of motion. He exhibits no edema or tenderness.  Neurological: He is alert. No sensory deficit.  Skin: Skin is warm and dry. There is erythema.  3 cm area of erythema and fluctuance to the right anterior shin distal to the knee, mildly tender to palpation.  No active drainage.  Psychiatric: His behavior is normal.  Blunted affect, does not appear  to be responding to internal stimuli  Nursing note and vitals reviewed.    ED Treatments / Results  Labs (all labs ordered are listed, but only abnormal results are displayed) Labs Reviewed - No data to display  EKG  EKG Interpretation None       Radiology No results found.  Procedures .Marland Kitchen.Incision and Drainage Date/Time: 10/04/2017 12:42 PM Performed by: Jeanie SewerFawze, Shawan Tosh A, PA-C Authorized by: Jeanie SewerFawze, Linsie Lupo A, PA-C   Location:    Location:  Lower extremity   Lower extremity location:  Leg   Leg location:  R lower leg Pre-procedure details:    Skin preparation:  Betadine Anesthesia (see MAR for exact dosages):     Anesthesia method:  Local infiltration   Local anesthetic:  Lidocaine 2% WITH epi Procedure type:    Complexity:  Simple Procedure details:    Needle aspiration: no     Incision types:  Stab incision   Incision depth:  Subcutaneous   Scalpel blade:  11   Wound management:  Probed and deloculated and irrigated with saline   Drainage:  Purulent   Drainage amount:  Copious   Wound treatment:  Wound left open   Packing materials:  None Post-procedure details:    Patient tolerance of procedure:  Tolerated well, no immediate complications   (including critical care time)  Medications Ordered in ED Medications  lidocaine-EPINEPHrine (XYLOCAINE W/EPI) 2 %-1:200000 (PF) injection 10 mL (10 mLs Intradermal Given 10/04/17 1229)     Initial Impression / Assessment and Plan / ED Course  I have reviewed the triage vital signs and the nursing notes.  Pertinent labs & imaging results that were available during my care of the patient were reviewed by me and considered in my medical decision making (see chart for details).    Patient with skin abscess  to right shin amenable to incision and drainage. Afebrille, VSS pt nontoxic in appearance. No constitutional symptoms. Abscess was not large enough to warrant packing or drain,  wound recheck in 2 days. Encouraged home warm soaks and flushing.  Mild signs of cellulitis is surrounding skin.  Will d/c to home.  No antibiotic therapy is indicated. Pt verbalized understanding of and agreement with plan and is safe for discharge home at this time. No complaints prior to discharge.    Final Clinical Impressions(s) / ED Diagnoses   Final diagnoses:  Abscess    ED Discharge Orders    None       Jeanie SewerFawze, Simran Bomkamp A, PA-C 10/04/17 1245

## 2017-10-04 NOTE — Discharge Instructions (Signed)
Keep wound clean and dry. Apply warm compresses throughout the day. Alternate 600 mg of ibuprofen and [phone] mg of Tylenol every 3 hours as needed for pain. Do not exceed 4000 mg of Tylenol daily.  [facility] recheck and packing removal.  Return to emergency department for emergent changing or worsening symptoms.

## 2017-10-04 NOTE — ED Triage Notes (Signed)
Wound to right shin, patient reports area has been there for years. Patient reports purulent drainage from area. + tenderness, swelling and hot to touch.

## 2018-02-27 ENCOUNTER — Encounter (HOSPITAL_COMMUNITY): Payer: Self-pay | Admitting: Emergency Medicine

## 2018-02-27 ENCOUNTER — Ambulatory Visit (HOSPITAL_COMMUNITY)
Admission: EM | Admit: 2018-02-27 | Discharge: 2018-02-27 | Disposition: A | Discharge status: Home / Self Care | Payer: Medicare Other | Attending: Family Medicine | Admitting: Family Medicine

## 2018-02-27 ENCOUNTER — Other Ambulatory Visit: Payer: Self-pay

## 2018-02-27 DIAGNOSIS — I714 Abdominal aortic aneurysm, without rupture, unspecified: Secondary | ICD-10-CM

## 2018-02-27 DIAGNOSIS — R197 Diarrhea, unspecified: Secondary | ICD-10-CM

## 2018-02-27 MED ORDER — LOPERAMIDE HCL 2 MG PO CAPS: 2.0000 mg | ORAL_CAPSULE | Freq: Four times a day (QID) | ORAL | 0 refills | Status: DC | PRN

## 2018-02-27 NOTE — ED Provider Notes (Signed)
[facility]   [id number] 02/27/18 Arrival Time: 1011   SUBJECTIVE:  Sean Lowe is a 62 y.o. male who presents to the urgent care with complaint of Diarrhea for 3 days.  Belching frequently.  Reports 7 episodes of diarrhea today  No blood in stool.  No abdominal pain or vomiting.  Other problems include schizophrenia and abdominal aortic aneurysm.  Past Medical History:  Diagnosis Date  . AAA (abdominal aortic aneurysm) (HCC)   . Anxiety   . Psychiatric diagnosis   . Schizophrenia (HCC)    History reviewed. No pertinent family history. Social History   Socioeconomic History  . Marital status: Single    Spouse name: Not on file  . Number of children: Not on file  . Years of education: Not on file  . Highest education level: Not on file  Occupational History  . Not on file  Social Needs  . Financial resource strain: Not on file  . Food insecurity:    Worry: Not on file    Inability: Not on file  . Transportation needs:    Medical: Not on file    Non-medical: Not on file  Tobacco Use  . Smoking status: Current Every Day Smoker    Packs/day: 2.00    Years: 42.00    Pack years: 84.00    Types: Cigarettes  . Smokeless tobacco: Never Used  Substance and Sexual Activity  . Alcohol use: Yes    Alcohol/week: 0.0 oz    Comment: occasional  . Drug use: No  . Sexual activity: Never    Birth control/protection: Abstinence  Lifestyle  . Physical activity:    Days per week: Not on file    Minutes per session: Not on file  . Stress: Not on file  Relationships  . Social connections:    Talks on phone: Not on file    Gets together: Not on file    Attends religious service: Not on file    Active member of club or organization: Not on file    Attends meetings of clubs or organizations: Not on file    Relationship status: Not on file  . Intimate partner violence:    Fear of current or ex partner: Not on file    Emotionally abused: Not on file   Physically abused: Not on file    Forced sexual activity: Not on file  Other Topics Concern  . Not on file  Social History Narrative  . Not on file   No outpatient medications have been marked as taking for the 02/27/18 encounter [facility](Hospital Encounter).   No Known Allergies    ROS: As per HPI, remainder of ROS negative.   OBJECTIVE:   Vitals:   02/27/18 1100  BP: 112/75  Pulse: (!) 106  Resp: 16  Temp: (!) 97.5 F (36.4 C)  TempSrc: Oral  SpO2: 100%     General appearance: alert; no distress Eyes: PERRL; EOMI; conjunctiva normal HENT: normocephalic; atraumatic; oral mucosa normal Neck: supple Lungs: clear to auscultation bilaterally Heart: regular rate and rhythm Abdomen: soft, non-tender; bowel sounds normal; palpable pulsatile mass in left center abdomen; no guarding or rebound tenderness Back: no CVA tenderness Extremities: no cyanosis or edema; symmetrical with no gross deformities Skin: warm and dry Neurologic: normal gait; grossly normal Psychological: alert and cooperative; normal mood and affect      Labs:  Results for orders placed or performed during the hospital encounter of 03/09/10  Culture, routine-abscess  Result Value Ref  Range   Specimen Description ABSCESS RIGHT BUTTOCKS    Special Requests NONE    Gram Stain      FEW WBC PRESENT, PREDOMINANTLY PMN NO SQUAMOUS EPITHELIAL CELLS SEEN NO ORGANISMS SEEN   Culture NO GROWTH 3 DAYS    Report Status 03/12/2010 FINAL     Labs Reviewed - No data to display  No results found.     ASSESSMENT & PLAN:  1. Diarrhea, unspecified type   2. Abdominal aortic aneurysm (AAA) without rupture (HCC)     Meds ordered this encounter  Medications  . loperamide (IMODIUM) 2 MG capsule    Sig: Take 1 capsule (2 mg total) by mouth 4 (four) times daily as needed for diarrhea or loose stools.    Dispense:  6 capsule    Refill:  0    Reviewed expectations re: course of current medical issues. Questions  answered. Outlined signs and symptoms indicating need for more acute intervention. Patient verbalized understanding. After Visit Summary given.       Elvina Sidle, MD 02/27/18 1114

## 2018-02-27 NOTE — ED Triage Notes (Signed)
Diarrhea for 3 days.  Belching frequently.  Reports 7 episodes of diarrhea today

## 2018-04-20 ENCOUNTER — Ambulatory Visit (INDEPENDENT_AMBULATORY_CARE_PROVIDER_SITE_OTHER): Payer: Medicare Other | Admitting: Family

## 2018-04-20 ENCOUNTER — Encounter: Payer: Self-pay | Admitting: Family

## 2018-04-20 ENCOUNTER — Other Ambulatory Visit: Payer: Self-pay

## 2018-04-20 ENCOUNTER — Ambulatory Visit (HOSPITAL_COMMUNITY)
Admission: RE | Admit: 2018-04-20 | Discharge: 2018-04-20 | Disposition: A | Discharge status: Home / Self Care | Payer: Medicare Other | Source: Ambulatory Visit | Attending: Family | Admitting: Family

## 2018-04-20 VITALS — BP 120/81 | HR 72 | Temp 97.6°F | Resp 16 | Ht 70.0 in | Wt 146.0 lb

## 2018-04-20 DIAGNOSIS — F17200 Nicotine dependence, unspecified, uncomplicated: Secondary | ICD-10-CM | POA: Diagnosis not present

## 2018-04-20 DIAGNOSIS — I714 Abdominal aortic aneurysm, without rupture, unspecified: Secondary | ICD-10-CM

## 2018-04-20 NOTE — Progress Notes (Addendum)
[facility] [place]   CC: Follow up Abdominal Aortic Aneurysm  History of Present Illness  Sean Lowe is a 62 y.o. (29-Jun-1956) male  patient of Dr. Darrick Penna who presents for evaluation ofabdominal aortic aneurysm. Patient's aneurysm was discovered on a screening ultrasound. This was in October 2016. Aneurysm at that time was 3.8 cm. This was on an ultrasound performed at [facility]. The patient denies any abdominal or back pain. He denies any family history of abdominal aortic aneurysm.   Other medical problems include schizophrenia and he lives in a group home related to this [facility]). He is able to sign his own consent.  The patient no claudication in legs with walking. He walks an hour or more daily. He states his "knees give every once in a while".  The patient no history of stroke or TIA symptoms.  Pt Diabetic: pt states prediabetes  Pt smoker: smoker (1 ppd, started at age 79 yrs)   Past Medical History:  Diagnosis Date  . AAA (abdominal aortic aneurysm) (HCC)   . Anxiety   . Psychiatric diagnosis   . Schizophrenia [facility])    Past Surgical History:  Procedure Laterality Date  . FINGER SURGERY Left    repair from injury   Social History Social History   Socioeconomic History  . Marital status: Single    Spouse name: Not on file  . Number of children: Not on file  . Years of education: Not on file  . Highest education level: Not on file  Occupational History  . Not on file  Social Needs  . Financial resource strain: Not on file  . Food insecurity:    Worry: Not on file    Inability: Not on file  . Transportation needs:    Medical: Not on file    Non-medical: Not on file  Tobacco Use  . Smoking status: Current Every Day Smoker    Packs/day: 2.00    Years: 42.00    Pack years: 84.00    Types: Cigarettes  . Smokeless tobacco: Never Used  Substance and Sexual Activity  . Alcohol use: Yes    Alcohol/week: 0.0  oz    Comment: occasional  . Drug use: No  . Sexual activity: Never    Birth control/protection: Abstinence  Lifestyle  . Physical activity:    Days per week: Not on file    Minutes per session: Not on file  . Stress: Not on file  Relationships  . Social connections:    Talks on phone: Not on file    Gets together: Not on file    Attends religious service: Not on file    Active member of club or organization: Not on file    Attends meetings of clubs or organizations: Not on file    Relationship status: Not on file  . Intimate partner violence:    Fear of current or ex partner: Not on file    Emotionally abused: Not on file    Physically abused: Not on file    Forced sexual activity: Not on file  Other Topics Concern  . Not on file  Social History Narrative  . Not on file   Family History History reviewed. No pertinent family history.  Current Outpatient Medications on File Prior to Visit  Medication Sig Dispense Refill  . acetaminophen (TYLENOL) 325 MG tablet Take 650 mg by mouth every 4 (four) hours as needed. Reported on 12/25/2015    . cholecalciferol (  VITAMIN D) 1000 UNITS tablet Take 50,000 Units by mouth once a week.     . fluPHENAZine (PROLIXIN) 2.5 MG tablet Take 2.5 mg by mouth daily.    . fluPHENAZine (PROLIXIN) 5 MG tablet Take 5 mg by mouth daily.    Marland Kitchen loperamide (IMODIUM) 2 MG capsule Take 1 capsule (2 mg total) by mouth 4 (four) times daily as needed for diarrhea or loose stools. 6 capsule 0   No current facility-administered medications on file prior to visit.    No Known Allergies  ROS: See HPI for pertinent positives and negatives.  Physical Examination  Vitals:   04/20/18 0923  BP: 120/81  Pulse: 72  Resp: 16  Temp: 97.6 F (36.4 C)  TempSrc: Oral  SpO2: 97%  Weight: 146 lb (66.2 kg)  Height:  (1.778 m)   Body mass index is 20.95 kg/m.  General: A&O x 3, WD, male. HEENT: Grossly intact and WNL.  Pulmonary: Sym exp, respirations are  non labored, good air movt, CTAB, no rales, rhonchi, or wheezing. Cardiac: Regular rhythm and rate, no murmur.  Carotid Bruits Right Left   Negative Negative   Adominal aortic pulse is very palpable Radial pulses are 2+ palpable                           VASCULAR EXAM:                                                                                                         LE Pulses Right Left       FEMORAL  2+ palpable  2+ palpable        POPLITEAL  2+ palpable   2+ palpable       POSTERIOR TIBIAL  2+ palpable   2+ palpable        DORSALIS PEDIS      ANTERIOR TIBIAL not palpable  not palpable     Gastrointestinal: soft, NTND, -G/R, - HSM, - masses palpated, - CVAT B. Musculoskeletal: M/S 5/5 throughout, Extremities without ischemic changes. Skin: No rashes, no ulcers, no cellulitis.   Neurologic: CN 2-12 intact, Pain and light touch intact in extremities are intact, Motor exam as listed above. Psychiatric: Normal thought content, mood appropriate to clinical situation.    DATA  AAA Duplex (04/20/2018):  Previous size: 4.3 cm (Date: 04-14-17); Right CIA: 1.1 cm; Left CIA: 1.1 cm   Current size:  4.6 cm (Date: 04-20-18); Right CIA: 1.0 cm; Left CIA: 1.2 cm, limited visualization due to overlying bowel gas.    Medical Decision Making  The patient is a 62 y.o. male who presents with asymptomatic AAA with 3 mm increase in size in a year, based on limited visualization. I spoke with Dr. Edilia Bo and Early re very pulsatile AAA, but pt denies pain or discomfort, with 3 mm increase in size in a year.  The patient will follow up in 6 months  with the following studies: AAA, see Dr. Darrick Penna afterward.    Consideration for repair  of AAA would be made when the size is 5.0 cm, growth > 1 cm/yr, and symptomatic status.        The patient was given information about AAA including signs, symptoms, treatment, and how to minimize the risk of enlargement and rupture of aneurysms.    I  emphasized the importance of maximal medical management including strict control of blood pressure, blood glucose, and lipid levels, antiplatelet agents, obtaining regular exercise, and  cessation of smoking.   The patient was advised to call 911 should the patient experience sudden onset abdominal or back pain.   Thank you for allowing [place] to participate in this patient's care.  Charisse March, RN, MSN, FNP-C [facility] [place] Office: [phone]  Clinic Physician: Edilia Bo  04/20/2018, 9:33 AM

## 2018-04-20 NOTE — Patient Instructions (Addendum)
Before your next abdominal ultrasound:  Take two Extra-Strength Gas-X capsules at bedtime the night before the test. Take another two Extra-Strength Gas-X capsules 3 hours before the test.  Avoid gas forming foods and beverages the day before the test.      Steps to Quit Smoking Smoking tobacco can be bad for your health. It can also affect almost every organ in your body. Smoking puts you and people around you at risk for many serious long-lasting (chronic) diseases. Quitting smoking is hard, but it is one of the best things that you can do for your health. It is never too late to quit. What are the benefits of quitting smoking? When you quit smoking, you lower your risk for getting serious diseases and conditions. They can include:  Lung cancer or lung disease.  Heart disease.  Stroke.  Heart attack.  Not being able to have children (infertility).  Weak bones (osteoporosis) and broken bones (fractures).  If you have coughing, wheezing, and shortness of breath, those symptoms may get better when you quit. You may also get sick less often. If you are pregnant, quitting smoking can help to lower your chances of having a baby of low birth weight. What can I do to help me quit smoking? Talk with your doctor about what can help you quit smoking. Some things you can do (strategies) include:  Quitting smoking totally, instead of slowly cutting back how much you smoke over a period of time.  Going to in-person counseling. You are more likely to quit if you go to many counseling sessions.  Using resources and support systems, such as: ? Online chats with a counselor. ? Phone quitlines. ? Printed self-help materials. ? Support groups or group counseling. ? Text messaging programs. ? Mobile phone apps or applications.  Taking medicines. Some of these medicines may have nicotine in them. If you are pregnant or breastfeeding, do not take any medicines to quit smoking unless your doctor  says it is okay. Talk with your doctor about counseling or other things that can help you.  Talk with your doctor about using more than one strategy at the same time, such as taking medicines while you are also going to in-person counseling. This can help make quitting easier. What things can I do to make it easier to quit? Quitting smoking might feel very hard at first, but there is a lot that you can do to make it easier. Take these steps:  Talk to your family and friends. Ask them to support and encourage you.  Call phone quitlines, reach out to support groups, or work with a counselor.  Ask people who smoke to not smoke around you.  Avoid places that make you want (trigger) to smoke, such as: ? Bars. ? Parties. ? Smoke-break areas at work.  Spend time with people who do not smoke.  Lower the stress in your life. Stress can make you want to smoke. Try these things to help your stress: ? Getting regular exercise. ? Deep-breathing exercises. ? Yoga. ? Meditating. ? Doing a body scan. To do this, close your eyes, focus on one area of your body at a time from head to toe, and notice which parts of your body are tense. Try to relax the muscles in those areas.  Download or buy apps on your mobile phone or tablet that can help you stick to your quit plan. There are many free apps, such as QuitGuide from the CDC (Centers for Disease Control   and Prevention). You can find more support from smokefree.gov and other websites.  This information is not intended to replace advice given to you by your health care provider. Make sure you discuss any questions you have with your health care provider. Document Released: 09/11/2009 Document Revised: 07/13/2016 Document Reviewed: 04/01/2015 Elsevier Interactive Patient Education  2018 Elsevier Inc.    Abdominal Aortic Aneurysm Blood pumps away from the heart through tubes (blood vessels) called arteries. Aneurysms are weak or damaged places in the  wall of an artery. It bulges out like a balloon. An abdominal aortic aneurysm happens in the main artery of the body (aorta). It can burst or tear, causing bleeding inside the body. This is an emergency. It needs treatment right away. What are the causes? The exact cause is unknown. Things that could cause this problem include:  Fat and other substances building up in the lining of a tube.  Swelling of the walls of a blood vessel.  Certain tissue diseases.  Belly (abdominal) trauma.  An infection in the main artery of the body.  What increases the risk? There are things that make it more likely for you to have an aneurysm. These include:  Being over the age of 64 years old.  Having high blood pressure (hypertension).  Being a male.  Being white.  Being very overweight (obese).  Having a family history of aneurysm.  Using tobacco products.  What are the signs or symptoms? Symptoms depend on the size of the aneurysm and how fast it grows. There may not be symptoms. If symptoms occur, they can include:  Pain (belly, side, lower back, or groin).  Feeling full after eating a small amount of food.  Feeling sick to your stomach (nauseous), throwing up (vomiting), or both.  Feeling a lump in your belly that feels like it is beating (pulsating).  Feeling like you will pass out (faint).  How is this treated?  Medicine to control blood pressure and pain.  Imaging tests to see if the aneurysm gets bigger.  Surgery. How is this prevented? To lessen your chance of getting this condition:  Stop smoking. Stop chewing tobacco.  Limit or avoid alcohol.  Keep your blood pressure, blood sugar, and cholesterol within normal limits.  Eat less salt.  Eat foods low in saturated fats and cholesterol. These are found in animal and whole dairy products.  Eat more fiber. Fiber is found in whole grains, vegetables, and fruits.  Keep a healthy weight.  Stay active and exercise  often.  This information is not intended to replace advice given to you by your health care provider. Make sure you discuss any questions you have with your health care provider. Document Released: 03/12/2013 Document Revised: 04/22/2016 Document Reviewed: 12/15/2012 Elsevier Interactive Patient Education  2017 Elsevier Inc.  

## 2018-05-11 ENCOUNTER — Other Ambulatory Visit: Payer: Self-pay

## 2018-05-11 DIAGNOSIS — I714 Abdominal aortic aneurysm, without rupture, unspecified: Secondary | ICD-10-CM

## 2018-10-06 ENCOUNTER — Ambulatory Visit: Payer: Medicare Other | Admitting: Family

## 2018-10-06 ENCOUNTER — Other Ambulatory Visit (HOSPITAL_COMMUNITY): Payer: Medicare Other

## 2018-10-24 ENCOUNTER — Ambulatory Visit (INDEPENDENT_AMBULATORY_CARE_PROVIDER_SITE_OTHER): Payer: Medicare Other | Admitting: Family

## 2018-10-24 ENCOUNTER — Encounter: Payer: Self-pay | Admitting: Family

## 2018-10-24 ENCOUNTER — Other Ambulatory Visit: Payer: Self-pay

## 2018-10-24 ENCOUNTER — Ambulatory Visit (HOSPITAL_COMMUNITY)
Admission: RE | Admit: 2018-10-24 | Discharge: 2018-10-24 | Disposition: A | Discharge status: Home / Self Care | Payer: Medicare Other | Source: Ambulatory Visit | Attending: Family | Admitting: Family

## 2018-10-24 VITALS — BP 115/75 | HR 77 | Temp 97.2°F | Resp 20 | Ht 70.0 in | Wt 150.0 lb

## 2018-10-24 DIAGNOSIS — I714 Abdominal aortic aneurysm, without rupture, unspecified: Secondary | ICD-10-CM

## 2018-10-24 DIAGNOSIS — F17200 Nicotine dependence, unspecified, uncomplicated: Secondary | ICD-10-CM | POA: Diagnosis not present

## 2018-10-24 NOTE — Progress Notes (Signed)
[facility] [place]   CC: Follow up Abdominal Aortic Aneurysm  History of Present Illness  Sean Lowe is a 62 y.o. (1956-04-14) male whom Dr. Darrick Penna has been monitoring for abdominal aortic aneurysm. Patient's aneurysm was discovered on a screening ultrasound. This was in October 2016. Aneurysm at that time was 3.8 cm. This was on an ultrasound performed at [facility]. The patient denies any abdominal or back pain. He denies any family history of abdominal aortic aneurysm.   Other medical problems include schizophrenia and he lives in a group home related to this [facility]). He is able to sign his own consent.  The patient has no claudication symptoms in his legs with walking. He walks an houror more daily. He states his "knees give every once in a while".  The patient has no history of stroke or TIA symptoms.  Diabetic: pt states prediabetes  Tobacco use: smoker (1 ppd, started at age 64 yrs)    Past Medical History:  Diagnosis Date  . AAA (abdominal aortic aneurysm) (HCC)   . Anxiety   . Psychiatric diagnosis   . Schizophrenia [facility])    Past Surgical History:  Procedure Laterality Date  . FINGER SURGERY Left    repair from injury   Social History Social History   Socioeconomic History  . Marital status: Single    Spouse name: Not on file  . Number of children: Not on file  . Years of education: Not on file  . Highest education level: Not on file  Occupational History  . Not on file  Social Needs  . Financial resource strain: Not on file  . Food insecurity:    Worry: Not on file    Inability: Not on file  . Transportation needs:    Medical: Not on file    Non-medical: Not on file  Tobacco Use  . Smoking status: Current Every Day Smoker    Packs/day: 2.00    Years: 42.00    Pack years: 84.00    Types: Cigarettes  . Smokeless tobacco: Never Used  Substance and Sexual Activity  . Alcohol use: Yes   Alcohol/week: 0.0 standard drinks    Comment: occasional  . Drug use: No  . Sexual activity: Never    Birth control/protection: Abstinence  Lifestyle  . Physical activity:    Days per week: Not on file    Minutes per session: Not on file  . Stress: Not on file  Relationships  . Social connections:    Talks on phone: Not on file    Gets together: Not on file    Attends religious service: Not on file    Active member of club or organization: Not on file    Attends meetings of clubs or organizations: Not on file    Relationship status: Not on file  . Intimate partner violence:    Fear of current or ex partner: Not on file    Emotionally abused: Not on file    Physically abused: Not on file    Forced sexual activity: Not on file  Other Topics Concern  . Not on file  Social History Narrative  . Not on file   Family History History reviewed. No pertinent family history.  Current Outpatient Medications on File Prior to Visit  Medication Sig Dispense Refill  . acetaminophen (TYLENOL) 325 MG tablet Take 650 mg by mouth every 4 (four) hours as needed. Reported on 12/25/2015    . fluPHENAZine (  PROLIXIN) 2.5 MG tablet Take 2.5 mg by mouth daily.    . fluPHENAZine (PROLIXIN) 5 MG tablet Take 5 mg by mouth daily.    . cholecalciferol (VITAMIN D) 1000 UNITS tablet Take 50,000 Units by mouth once a week.     . loperamide (IMODIUM) 2 MG capsule Take 1 capsule (2 mg total) by mouth 4 (four) times daily as needed for diarrhea or loose stools. (Patient not taking: Reported on 10/24/2018) 6 capsule 0   No current facility-administered medications on file prior to visit.    No Known Allergies  ROS: See HPI for pertinent positives and negatives.  Physical Examination  Vitals:   10/24/18 0859  BP: 115/75  Pulse: 77  Resp: 20  Temp: (!) 97.2 F (36.2 C)  TempSrc: Oral  SpO2: 96%  Weight: 150 lb (68 kg)  Height: 5\' 10"  (1.778 m)   Body mass index is 21.52 kg/m.  General: A&O x 3,  WD, male. HEENT: Grossly intact and WNL.  Pulmonary: Sym exp, respirations are non labored, good air movt, CTAB, no rales, rhonchi, or wheezing. Cardiac: Regular rhythm and rate, no detected murmur.  Carotid Bruits Right Left   Negative Negative   Adominal aortic pulse is strongly palpable Radial pulses are 2+ palpable                          VASCULAR EXAM:                                                                                                         LE Pulses Right Left       FEMORAL  2+ palpable  2+ palpable        POPLITEAL  not palpable   not palpable       POSTERIOR TIBIAL  2+ palpable   2+ palpable        DORSALIS PEDIS      ANTERIOR TIBIAL not palpable  not palpable     Gastrointestinal: soft, NTND, -G/R, - HSM, - masses palpated, - CVAT B. Musculoskeletal: M/S 5/5 throughout, Extremities without ischemic changes. Skin: No rashes, no ulcers, no cellulitis.   Neurologic: CN 2-12 intact, Pain and light touch intact in extremities are intact,  Motor exam as listed above. Psychiatric: Normal thought content, mood appropriate to clinical situation.    DATA  AAA Duplex:  Previous size: 4.6 cm (Date: 04-20-18); Right CIA: 1.0 cm; Left CIA: 1.2 cm, limited visualization due to overlying bowel gas.  Current size:  4.7 cm (Date: (10/24/2018); Right CIA: 1.2 cm; Left CIA: 1.3 cm  Medical Decision Making  The patient is a 62 y.o. male who presents with asymptomatic AAA with stable size at 4.7 cm.   Based on this patient's exam and diagnostic studies, the patient will follow up in 6 months with the following studies: AAA duplex.  Consideration for repair of AAA would be made when the size is 5.0 cm, growth > 1 cm/yr, and symptomatic status.        The  patient was given information about AAA including signs, symptoms, treatment, and how to minimize the risk of enlargement and rupture of aneurysms.    I emphasized the importance of maximal medical management  including strict control of blood pressure, blood glucose, and lipid levels, antiplatelet agents, obtaining regular exercise, and  cessation of smoking.   The patient was advised to call 911 should the patient experience sudden onset abdominal or back pain.   Thank you for allowing us to participate in this patient's care.  Charisse MarchSuzanne Nickel, RN, MSN, FNP-C [facility] [place] Office: [phone]  Clinic Physician: Edilia BoDickson on call  10/24/2018, 9:06 AM

## 2018-10-24 NOTE — Patient Instructions (Signed)
Before your next abdominal ultrasound:  Avoid gas forming foods and beverages the day before the test.   Take two Extra-Strength Gas-X capsules at bedtime the night before the test. Take another two Extra-Strength Gas-X capsules in the middle of the night if you get up to the restroom, if not, first thing in the morning with water.  Do not chew gum.      Steps to Quit Smoking Smoking tobacco can be bad for your health. It can also affect almost every organ in your body. Smoking puts you and people around you at risk for many serious long-lasting (chronic) diseases. Quitting smoking is hard, but it is one of the best things that you can do for your health. It is never too late to quit. What are the benefits of quitting smoking? When you quit smoking, you lower your risk for getting serious diseases and conditions. They can include:  Lung cancer or lung disease.  Heart disease.  Stroke.  Heart attack.  Not being able to have children (infertility).  Weak bones (osteoporosis) and broken bones (fractures).  If you have coughing, wheezing, and shortness of breath, those symptoms may get better when you quit. You may also get sick less often. If you are pregnant, quitting smoking can help to lower your chances of having a baby of low birth weight. What can I do to help me quit smoking? Talk with your doctor about what can help you quit smoking. Some things you can do (strategies) include:  Quitting smoking totally, instead of slowly cutting back how much you smoke over a period of time.  Going to in-person counseling. You are more likely to quit if you go to many counseling sessions.  Using resources and support systems, such as: ? Online chats with a counselor. ? Phone quitlines. ? Printed self-help materials. ? Support groups or group counseling. ? Text messaging programs. ? Mobile phone apps or applications.  Taking medicines. Some of these medicines may have nicotine in them.  If you are pregnant or breastfeeding, do not take any medicines to quit smoking unless your doctor says it is okay. Talk with your doctor about counseling or other things that can help you.  Talk with your doctor about using more than one strategy at the same time, such as taking medicines while you are also going to in-person counseling. This can help make quitting easier. What things can I do to make it easier to quit? Quitting smoking might feel very hard at first, but there is a lot that you can do to make it easier. Take these steps:  Talk to your family and friends. Ask them to support and encourage you.  Call phone quitlines, reach out to support groups, or work with a counselor.  Ask people who smoke to not smoke around you.  Avoid places that make you want (trigger) to smoke, such as: ? Bars. ? Parties. ? Smoke-break areas at work.  Spend time with people who do not smoke.  Lower the stress in your life. Stress can make you want to smoke. Try these things to help your stress: ? Getting regular exercise. ? Deep-breathing exercises. ? Yoga. ? Meditating. ? Doing a body scan. To do this, close your eyes, focus on one area of your body at a time from head to toe, and notice which parts of your body are tense. Try to relax the muscles in those areas.  Download or buy apps on your mobile phone or tablet that   can help you stick to your quit plan. There are many free apps, such as QuitGuide from the CDC (Centers for Disease Control and Prevention). You can find more support from smokefree.gov and other websites.  This information is not intended to replace advice given to you by your health care provider. Make sure you discuss any questions you have with your health care provider. Document Released: 09/11/2009 Document Revised: 07/13/2016 Document Reviewed: 04/01/2015 Elsevier Interactive Patient Education  2018 Elsevier Inc.     Abdominal Aortic Aneurysm Blood pumps away from  the heart through tubes (blood vessels) called arteries. Aneurysms are weak or damaged places in the wall of an artery. It bulges out like a balloon. An abdominal aortic aneurysm happens in the main artery of the body (aorta). It can burst or tear, causing bleeding inside the body. This is an emergency. It needs treatment right away. What are the causes? The exact cause is unknown. Things that could cause this problem include:  Fat and other substances building up in the lining of a tube.  Swelling of the walls of a blood vessel.  Certain tissue diseases.  Belly (abdominal) trauma.  An infection in the main artery of the body.  What increases the risk? There are things that make it more likely for you to have an aneurysm. These include:  Being over the age of 64 years old.  Having high blood pressure (hypertension).  Being a male.  Being white.  Being very overweight (obese).  Having a family history of aneurysm.  Using tobacco products.  What are the signs or symptoms? Symptoms depend on the size of the aneurysm and how fast it grows. There may not be symptoms. If symptoms occur, they can include:  Pain (belly, side, lower back, or groin).  Feeling full after eating a small amount of food.  Feeling sick to your stomach (nauseous), throwing up (vomiting), or both.  Feeling a lump in your belly that feels like it is beating (pulsating).  Feeling like you will pass out (faint).  How is this treated?  Medicine to control blood pressure and pain.  Imaging tests to see if the aneurysm gets bigger.  Surgery. How is this prevented? To lessen your chance of getting this condition:  Stop smoking. Stop chewing tobacco.  Limit or avoid alcohol.  Keep your blood pressure, blood sugar, and cholesterol within normal limits.  Eat less salt.  Eat foods low in saturated fats and cholesterol. These are found in animal and whole dairy products.  Eat more fiber. Fiber is  found in whole grains, vegetables, and fruits.  Keep a healthy weight.  Stay active and exercise often.  This information is not intended to replace advice given to you by your health care provider. Make sure you discuss any questions you have with your health care provider. Document Released: 03/12/2013 Document Revised: 04/22/2016 Document Reviewed: 12/15/2012 Elsevier Interactive Patient Education  2017 Elsevier Inc.  

## 2018-10-29 ENCOUNTER — Encounter: Payer: Self-pay | Admitting: Family

## 2019-04-24 ENCOUNTER — Ambulatory Visit: Payer: Medicare Other | Admitting: Family

## 2019-04-24 ENCOUNTER — Other Ambulatory Visit (HOSPITAL_COMMUNITY): Payer: Medicare Other

## 2019-06-20 ENCOUNTER — Other Ambulatory Visit: Payer: Self-pay

## 2019-06-20 DIAGNOSIS — I714 Abdominal aortic aneurysm, without rupture, unspecified: Secondary | ICD-10-CM

## 2019-06-26 ENCOUNTER — Telehealth (HOSPITAL_COMMUNITY): Payer: Self-pay

## 2019-06-26 NOTE — Telephone Encounter (Signed)

## 2019-06-27 ENCOUNTER — Other Ambulatory Visit: Payer: Self-pay

## 2019-06-27 ENCOUNTER — Ambulatory Visit (HOSPITAL_COMMUNITY)
Admission: RE | Admit: 2019-06-27 | Discharge: 2019-06-27 | Disposition: A | Discharge status: Home / Self Care | Payer: Medicare Other | Source: Ambulatory Visit | Attending: Family | Admitting: Family

## 2019-06-27 ENCOUNTER — Encounter: Payer: Self-pay | Admitting: Family

## 2019-06-27 ENCOUNTER — Ambulatory Visit (INDEPENDENT_AMBULATORY_CARE_PROVIDER_SITE_OTHER): Payer: Medicare Other | Admitting: Family

## 2019-06-27 VITALS — BP 109/75 | HR 61 | Temp 97.0°F | Resp 16 | Ht 70.0 in | Wt 144.0 lb

## 2019-06-27 DIAGNOSIS — I714 Abdominal aortic aneurysm, without rupture, unspecified: Secondary | ICD-10-CM

## 2019-06-27 DIAGNOSIS — F17200 Nicotine dependence, unspecified, uncomplicated: Secondary | ICD-10-CM

## 2019-06-27 NOTE — Progress Notes (Signed)
[facility] [place]   CC: Follow up Abdominal Aortic Aneurysm  History of Present Illness  Sean Lowe is a 63 y.o. (12/05/1955) male whom Dr. Oneida Alar has been monitoring for abdominal aortic aneurysm. Patient's aneurysm was discovered on a screening ultrasound at [facility] in October 2016.  Aneurysm at that time was 3.8 cm.   The patient denies any abdominal or back pain. He denies any family history of abdominal aortic aneurysm.   Other medical problems include schizophrenia and he lives in a group home related to this ([facility]). He is able to sign his own consent.  He reports bilateral posterior thigh and calf feeling of tightening that started about early in 2020, improves with walking. He denies any known back problems.   The patient has no claudication symptoms in his legs with walking. He walks an houror more daily. He states his "knees give way every once in a while". The patient has no history of stroke or TIA symptoms.  Diabetic:pt states prediabetes Tobacco use: smoker (1 ppd, started at age 48 yrs)    Past Medical History:  Diagnosis Date  . AAA (abdominal aortic aneurysm) ([place])   . Anxiety   . Psychiatric diagnosis   . Schizophrenia [facility])    Past Surgical History:  Procedure Laterality Date  . FINGER SURGERY Left    repair from injury   Social History Social History   Socioeconomic History  . Marital status: Single    Spouse name: Not on file  . Number of children: Not on file  . Years of education: Not on file  . Highest education level: Not on file  Occupational History  . Not on file  Social Needs  . Financial resource strain: Not on file  . Food insecurity    Worry: Not on file    Inability: Not on file  . Transportation needs    Medical: Not on file    Non-medical: Not on file  Tobacco Use  . Smoking status: Current Every Day Smoker    Packs/day: 2.00    Years: 42.00    Pack years: 84.00     Types: Cigarettes  . Smokeless tobacco: Never Used  Substance and Sexual Activity  . Alcohol use: Yes    Alcohol/week: 0.0 standard drinks    Comment: occasional  . Drug use: No  . Sexual activity: Never    Birth control/protection: Abstinence  Lifestyle  . Physical activity    Days per week: Not on file    Minutes per session: Not on file  . Stress: Not on file  Relationships  . Social Herbalist on phone: Not on file    Gets together: Not on file    Attends religious service: Not on file    Active member of club or organization: Not on file    Attends meetings of clubs or organizations: Not on file    Relationship status: Not on file  . Intimate partner violence    Fear of current or ex partner: Not on file    Emotionally abused: Not on file    Physically abused: Not on file    Forced sexual activity: Not on file  Other Topics Concern  . Not on file  Social History Narrative  . Not on file   Family History History reviewed. No pertinent family history.  Current Outpatient Medications on File Prior to Visit  Medication Sig Dispense Refill  . acetaminophen (TYLENOL)  325 MG tablet Take 650 mg by mouth every 4 (four) hours as needed. Reported on 12/25/2015    . cholecalciferol (VITAMIN D) 1000 UNITS tablet Take 50,000 Units by mouth once a week.     . fluPHENAZine (PROLIXIN) 2.5 MG tablet Take 2.5 mg by mouth daily.    . fluPHENAZine (PROLIXIN) 5 MG tablet Take 5 mg by mouth daily.    Marland Kitchen. loperamide (IMODIUM) 2 MG capsule Take 1 capsule (2 mg total) by mouth 4 (four) times daily as needed for diarrhea or loose stools. (Patient not taking: Reported on 10/24/2018) 6 capsule 0   No current facility-administered medications on file prior to visit.    No Known Allergies  ROS: See HPI for pertinent positives and negatives.  Physical Examination  Vitals:   06/27/19 0920  BP: 109/75  Pulse: 61  Resp: 16  Temp: (!) 97 F (36.1 C)  TempSrc: Temporal  SpO2: 99%   Weight: 144 lb (65.3 kg)  Height: 5\' 10"  (1.778 m)   Body mass index is 20.66 kg/m.  General: A&O x 3, WD, slim male. HEENT: Grossly intact and WNL. Prominent laryngeal prominence of the thyroid cartilage Pulmonary: Sym exp, respirations are non labored, fair air move,ment in all fields, no rales, rhonchi, or wheezing. Cardiac: Regular rhythm and rate, no detected murmur.  Carotid Bruits Right Left   Negative Negative   Adominal aortic pulse remains strongly palpable Radial pulses are 2+ palpable                           VASCULAR EXAM:                                                                                                         LE Pulses Right Left       FEMORAL  2+ palpable  2+ palpable        POPLITEAL  not palpable   not palpable       POSTERIOR TIBIAL  2+ palpable   2+ palpable        DORSALIS PEDIS      ANTERIOR TIBIAL not palpable  not palpable     Gastrointestinal: soft, NTND, -G/R, - HSM, - masses palpated, - CVAT B. Musculoskeletal: M/S 5/5 throughout, Extremities without ischemic changes. Skin: No rashes, no ulcers, no cellulitis.   Neurologic: CN 2-12 intact, Pain and light touch intact in extremities are intact, Motor exam as listed above. Psychiatric: Normal thought content, mood appropriate to clinical situation.     DATA  AAA Duplex (06/27/2019):  Previous size: 4.7 cm (Date: (10/24/2018); Right CIA: 1.2 cm; Left CIA: 1.3 cm   Abdominal Aorta Duplex Findings (Date: 06/27/19): +-----------+-------+----------+----------+--------+--------+--------+ Location   AP (cm)Trans (cm)PSV (cm/s)WaveformThrombusComments +-----------+-------+----------+----------+--------+--------+--------+ Proximal   2.48   2.49      87                                 +-----------+-------+----------+----------+--------+--------+--------+ Mid  4.78   4.86      66                                  +-----------+-------+----------+----------+--------+--------+--------+ Distal     2.66   2.52      24                                 +-----------+-------+----------+----------+--------+--------+--------+ RT CIA Prox1.1    1.0       128                                +-----------+-------+----------+----------+--------+--------+--------+ LT CIA Prox1.1    1.0       123                                +-----------+-------+----------+----------+--------+--------+--------+ Summary: Abdominal Aorta: There is evidence of abnormal dilitation of the Mid Abdominal aorta. The largest aortic diameter remains essentially unchanged compared to prior exam. Previous diameter measurement was 4.7 cm obtained on 10/24/2018.   Medical Decision Making  The patient is a 63 y.o. male who presents with asymptomatic AAA with slight increase in size to 4.9 cm today, was 4.7 cm on 10-24-18.   His blood pressure remains in good control, however he does continue to smoke.  Over 3 minutes was spent counseling patient re smoking cessation, and patient was given several free resources re smoking cessation.   Based on this patient's exam and diagnostic studies, the patient will follow up in 6 months with the following studies: CTA abd/pelvis and see Dr. Darrick PennaFields afterward.  Consideration for repair of AAA would be made when the size is 5.5cm, growth > 1 cm/yr, and symptomatic status.  Abdominal aortic aneurysm less than 5-1/2 cm in diameter has less then 1/2% risk of rupture per year.        The patient was given information about AAA including signs, symptoms, treatment, and how to minimize the risk of enlargement and rupture of aneurysms.    I emphasized the importance of maximal medical management including strict control of blood pressure, blood glucose, and lipid levels, antiplatelet agents, obtaining regular exercise, and  cessation of smoking.   The patient was advised to call 911 should  the patient experience sudden onset abdominal or back pain.   Thank you for allowing us to participate in this patient's care.  Charisse MarchSuzanne Nickel, RN, MSN, FNP-C [facility] [place] Office: [phone]  Clinic Physician: Veda CanningDickson, Fields  06/27/2019, 9:28 AM

## 2019-06-27 NOTE — Patient Instructions (Signed)
Before your next abdominal ultrasound: ° °Avoid gas forming foods and beverages the day before the test.   °Take two Extra-Strength Gas-X capsules at bedtime the night before the test. °Take another two Extra-Strength Gas-X capsules in the middle of the night if you get up to the restroom, if not, first thing in the morning with water.  °Do not chew gum.  ° ° ° °Steps to Quit Smoking °Smoking tobacco is the leading cause of preventable death. It can affect almost every organ in the body. Smoking puts you and people around you at risk for many serious, long-lasting (chronic) diseases. Quitting smoking can be hard, but it is one of the best things that you can do for your health. It is never too late to quit. °How do I get ready to quit? °When you decide to quit smoking, make a plan to help you succeed. Before you quit: °· Pick a date to quit. Set a date within the next 2 weeks to give you time to prepare. °· Write down the reasons why you are quitting. Keep this list in places where you will see it often. °· Tell your family, friends, and co-workers that you are quitting. Their support is important. °· Talk with your doctor about the choices that may help you quit. °· Find out if your health insurance will pay for these treatments. °· Know the people, places, things, and activities that make you want to smoke (triggers). Avoid them. °What first steps can I take to quit smoking? °· Throw away all cigarettes at home, at work, and in your car. °· Throw away the things that you use when you smoke, such as ashtrays and lighters. °· Clean your car. Make sure to empty the ashtray. °· Clean your home, including curtains and carpets. °What can I do to help me quit smoking? °Talk with your doctor about taking medicines and seeing a counselor at the same time. You are more likely to succeed when you do both. °· If you are pregnant or breastfeeding, talk with your doctor about counseling or other ways to quit smoking. Do not  take medicine to help you quit smoking unless your doctor tells you to do so. °To quit smoking: °Quit right away °· Quit smoking totally, instead of slowly cutting back on how much you smoke over a period of time. °· Go to counseling. You are more likely to quit if you go to counseling sessions regularly. °Take medicine °You may take medicines to help you quit. Some medicines need a prescription, and some you can buy over-the-counter. Some medicines may contain a drug called nicotine to replace the nicotine in cigarettes. Medicines may: °· Help you to stop having the desire to smoke (cravings). °· Help to stop the problems that come when you stop smoking (withdrawal symptoms). °Your doctor may ask you to use: °· Nicotine patches, gum, or lozenges. °· Nicotine inhalers or sprays. °· Non-nicotine medicine that is taken by mouth. °Find resources °Find resources and other ways to help you quit smoking and remain smoke-free after you quit. These resources are most helpful when you use them often. They include: °· Online chats with a counselor. °· Phone quitlines. °· Printed self-help materials. °· Support groups or group counseling. °· Text messaging programs. °· Mobile phone apps. Use apps on your mobile phone or tablet that can help you stick to your quit plan. There are many free apps for mobile phones and tablets as well as websites. Examples include Quit   Guide from the CDC and smokefree.gov ° °What things can I do to make it easier to quit? ° °· Talk to your family and friends. Ask them to support and encourage you. °· Call a phone quitline (1-800-QUIT-NOW), reach out to support groups, or work with a counselor. °· Ask people who smoke to not smoke around you. °· Avoid places that make you want to smoke, such as: °? Bars. °? Parties. °? Smoke-break areas at work. °· Spend time with people who do not smoke. °· Lower the stress in your life. Stress can make you want to smoke. Try these things to help your  stress: °? Getting regular exercise. °? Doing deep-breathing exercises. °? Doing yoga. °? Meditating. °? Doing a body scan. To do this, close your eyes, focus on one area of your body at a time from head to toe. Notice which parts of your body are tense. Try to relax the muscles in those areas. °How will I feel when I quit smoking? °Day 1 to 3 weeks °Within the first 24 hours, you may start to have some problems that come from quitting tobacco. These problems are very bad 2-3 days after you quit, but they do not often last for more than 2-3 weeks. You may get these symptoms: °· Mood swings. °· Feeling restless, nervous, angry, or annoyed. °· Trouble concentrating. °· Dizziness. °· Strong desire for high-sugar foods and nicotine. °· Weight gain. °· Trouble pooping (constipation). °· Feeling like you may vomit (nausea). °· Coughing or a sore throat. °· Changes in how the medicines that you take for other issues work in your body. °· Depression. °· Trouble sleeping (insomnia). °Week 3 and afterward °After the first 2-3 weeks of quitting, you may start to notice more positive results, such as: °· Better sense of smell and taste. °· Less coughing and sore throat. °· Slower heart rate. °· Lower blood pressure. °· Clearer skin. °· Better breathing. °· Fewer sick days. °Quitting smoking can be hard. Do not give up if you fail the first time. Some people need to try a few times before they succeed. Do your best to stick to your quit plan, and talk with your doctor if you have any questions or concerns. °Summary °· Smoking tobacco is the leading cause of preventable death. Quitting smoking can be hard, but it is one of the best things that you can do for your health. °· When you decide to quit smoking, make a plan to help you succeed. °· Quit smoking right away, not slowly over a period of time. °· When you start quitting, seek help from your doctor, family, or friends. °This information is not intended to replace advice  given to you by your health care provider. Make sure you discuss any questions you have with your health care provider. °Document Released: 09/11/2009 Document Revised: 02/02/2019 Document Reviewed: 02/03/2019 °Elsevier Patient Education © 2020 [employer] ° ° ° ° ° °Abdominal Aortic Aneurysm ° °An aneurysm is a bulge in one of the blood vessels that carry blood away from the heart (artery). It happens when blood pushes up against a weak or damaged place in the wall of an artery. An abdominal aortic aneurysm happens in the main artery of the body (aorta). °Some aneurysms may not cause problems. If it grows, it can burst or tear, causing bleeding inside the body. This is an emergency. It needs to be treated right away. °What are the causes? °The exact cause of this condition is   not known. °What increases the risk? °The following may make you more likely to get this condition: °· Being a male who is 60 years of age or older. °· Being white (Caucasian). °· Using tobacco. °· Having a family history of aneurysms. °· Having the following conditions: °? Hardening of the arteries (arteriosclerosis). °? Inflammation of the walls of an artery (arteritis). °? Certain genetic conditions. °? Being very overweight (obesity). °? An infection in the wall of the aorta (infectious aortitis). °? High cholesterol. °? High blood pressure (hypertension). °What are the signs or symptoms? °Symptoms depend on the size of the aneurysm and how fast it is growing. Most grow slowly and do not cause any symptoms. If symptoms do occur, they may include: °· Pain in the belly (abdomen), side, or back. °· Feeling full after eating only small amounts of food. °· Feeling a throbbing lump in the belly. °Symptoms that the aneurysm has burst (ruptured) include: °· Sudden, very bad pain in the belly, side, or back. °· Feeling sick to your stomach (nauseous). °· Throwing up (vomiting). °· Feeling light-headed or passing out. °How is this  treated? °Treatment for this condition depends on: °· The size of the aneurysm. °· How fast it is growing. °· Your age. °· Your risk of having it burst. °If your aneurysm is smaller than 2 inches (5 cm), your doctor may manage it by: °· Checking it often to see if it is getting bigger. You may have an imaging test (ultrasound) to check it every 3-6 months, every year, or every few years. °· Giving you medicines to: °? Control blood pressure. °? Treat pain. °? Fight infection. °If your aneurysm is larger than 2 inches (5 cm), you may need surgery to fix it. °Follow these instructions at home: °Lifestyle °· Do not use any products that have nicotine or tobacco in them. This includes cigarettes, e-cigarettes, and chewing tobacco. If you need help quitting, ask your doctor. °· Get regular exercise. Ask your doctor what types of exercise are best for you. °Eating and drinking °· Eat a heart-healthy diet. This includes eating plenty of: °? Fresh fruits and vegetables. °? Whole grains. °? Low-fat (lean) protein. °? Low-fat dairy products. °· Avoid foods that are high in saturated fat and cholesterol. These foods include red meat and some dairy products. °· Do not drink alcohol if: °? Your doctor tells you not to drink. °? You are pregnant, may be pregnant, or are planning to become pregnant. °· If you drink alcohol: °? Limit how much you use to: °§ 0-1 drink a day for women. °§ 0-2 drinks a day for men. °? Be aware of how much alcohol is in your drink. In the [place] one drink equals any of these: °§ One typical bottle of beer (12 oz). °§ One-half glass of wine (5 oz). °§ One shot of hard liquor (1½ oz). °General instructions °· Take over-the-counter and prescription medicines only as told by your doctor. °· Keep your blood pressure within normal limits. Ask your doctor what your blood pressure should be. °· Have your blood sugar (glucose) level and cholesterol levels checked regularly. Keep your blood sugar level and  cholesterol levels within normal limits. °· Avoid heavy lifting and activities that take a lot of effort. Ask your doctor what activities are safe for you. °· Keep all follow-up visits as told by your doctor. This is important. °? Talk to your doctor about regular screenings to see if the aneurysm is getting bigger. °  Contact a doctor if you: °· Have pain in your belly, side, or back. °· Have a throbbing feeling in your belly. °· Have a family history of aneurysms. °Get help right away if you: °· Have sudden, bad pain in your belly, side, or back. °· Feel sick to your stomach. °· Throw up. °· Have trouble pooping (constipation). °· Have trouble peeing (urinating). °· Feel light-headed. °· Have a fast heart rate when you stand. °· Have sweaty skin that is cold to the touch (clammy). °· Have shortness of breath. °· Have a fever. °These symptoms may be an emergency. Do not wait to see if the symptoms will go away. Get medical help right away. Call your local emergency services (911 in the [place] Do not drive yourself to the hospital. °Summary °· An aneurysm is a bulge in one of the blood vessels that carry blood away from the heart (artery). Some aneurysms may not cause problems. °· You may need to have yours checked often. If it grows, it can burst or tear. This causes bleeding inside the body. It needs to be treated right away. °· Follow instructions from your doctor about healthy lifestyle changes. °· Keep all follow-up visits as told by your doctor. This is important. °This information is not intended to replace advice given to you by your health care provider. Make sure you discuss any questions you have with your health care provider. °Document Released: 03/12/2013 Document Revised: 03/05/2019 Document Reviewed: 06/24/2018 °Elsevier Patient Education © 2020 [employer] ° °

## 2019-12-06 ENCOUNTER — Other Ambulatory Visit: Payer: Self-pay

## 2019-12-06 DIAGNOSIS — I714 Abdominal aortic aneurysm, without rupture, unspecified: Secondary | ICD-10-CM

## 2019-12-21 ENCOUNTER — Other Ambulatory Visit: Payer: Medicare Other

## 2019-12-27 ENCOUNTER — Ambulatory Visit: Payer: Medicare Other | Admitting: Vascular Surgery

## 2020-02-13 ENCOUNTER — Ambulatory Visit
Admission: RE | Admit: 2020-02-13 | Discharge: 2020-02-13 | Disposition: A | Discharge status: Home / Self Care | Payer: Medicare Other | Source: Ambulatory Visit | Attending: Vascular Surgery | Admitting: Vascular Surgery

## 2020-02-13 ENCOUNTER — Other Ambulatory Visit: Payer: Self-pay

## 2020-02-13 DIAGNOSIS — I714 Abdominal aortic aneurysm, without rupture, unspecified: Secondary | ICD-10-CM

## 2020-02-13 MED ORDER — IOPAMIDOL (ISOVUE-370) INJECTION 76%
75.0000 mL | Freq: Once | INTRAVENOUS | Status: AC | PRN
  Administered 2020-02-13: 75 mL via INTRAVENOUS

## 2020-02-14 ENCOUNTER — Ambulatory Visit (INDEPENDENT_AMBULATORY_CARE_PROVIDER_SITE_OTHER): Payer: Medicare Other | Admitting: Vascular Surgery

## 2020-02-14 ENCOUNTER — Other Ambulatory Visit: Payer: Self-pay

## 2020-02-14 ENCOUNTER — Encounter: Payer: Self-pay | Admitting: Vascular Surgery

## 2020-02-14 VITALS — BP 112/76 | HR 87 | Temp 97.7°F | Resp 20 | Ht 70.0 in | Wt 153.8 lb

## 2020-02-14 DIAGNOSIS — I714 Abdominal aortic aneurysm, without rupture, unspecified: Secondary | ICD-10-CM

## 2020-02-14 MED ORDER — ROSUVASTATIN CALCIUM 10 MG PO TABS: 10.0000 mg | ORAL_TABLET | Freq: Every day | ORAL | 11 refills | Status: DC

## 2020-02-14 MED ORDER — ASPIRIN EC 81 MG PO TBEC: 81.0000 mg | DELAYED_RELEASE_TABLET | Freq: Every day | ORAL | 6 refills | Status: DC

## 2020-02-14 NOTE — Progress Notes (Signed)
Patient name: Sean Lowe MRN: [id number] DOB: 04-16-1956 Sex: male   HPI: Sean Lowe is a 64 y.o. male, that we have been following for several years for abdominal aortic aneurysm.  Aneurysm was initially diagnosed in 2016 with a diameter of 3.8 cm.  He was last seen in July 2020 at that time his aneurysm was 4.9 cm.  He returns today after CT angiogram of the abdomen and pelvis performed earlier this week.  Aneurysm is now 5.2 cm in diameter.  Other medical problems include schizophrenia.  He does live in a group home.  He continues to smoke about a pack of cigarettes per day.  He was counseled against this today.  Past Medical History:  Diagnosis Date  . AAA (abdominal aortic aneurysm) ([place])   . Anxiety   . Psychiatric diagnosis   . Schizophrenia [facility])    Past Surgical History:  Procedure Laterality Date  . FINGER SURGERY Left    repair from injury    History reviewed. No pertinent family history.  SOCIAL HISTORY: Social History   Socioeconomic History  . Marital status: Single    Spouse name: Not on file  . Number of children: Not on file  . Years of education: Not on file  . Highest education level: Not on file  Occupational History  . Not on file  Tobacco Use  . Smoking status: Current Every Day Smoker    Packs/day: 1.50    Years: 42.00    Pack years: 63.00    Types: Cigarettes  . Smokeless tobacco: Never Used  Substance and Sexual Activity  . Alcohol use: Yes    Alcohol/week: 0.0 standard drinks    Comment: occasional  . Drug use: No  . Sexual activity: Never    Birth control/protection: Abstinence  Other Topics Concern  . Not on file  Social History Narrative  . Not on file   Social Determinants of Health   Financial Resource Strain:   . Difficulty of Paying Living Expenses:   Food Insecurity:   . Worried About Charity fundraiser in the Last Year:   . Arboriculturist in the Last Year:   Transportation Needs:   . Lexicographer (Medical):   Marland Kitchen Lack of Transportation (Non-Medical):   Physical Activity:   . Days of Exercise per Week:   . Minutes of Exercise per Session:   Stress:   . Feeling of Stress :   Social Connections:   . Frequency of Communication with Friends and Family:   . Frequency of Social Gatherings with Friends and Family:   . Attends Religious Services:   . Active Member of Clubs or Organizations:   . Attends Archivist Meetings:   Marland Kitchen Marital Status:   Intimate Partner Violence:   . Fear of Current or Ex-Partner:   . Emotionally Abused:   Marland Kitchen Physically Abused:   . Sexually Abused:     No Known Allergies  Current Outpatient Medications  Medication Sig Dispense Refill  . acetaminophen (TYLENOL) 325 MG tablet Take 650 mg by mouth every 4 (four) hours as needed. Reported on 12/25/2015    . fluPHENAZine (PROLIXIN) 2.5 MG tablet Take 7.5 mg by mouth daily.     . cholecalciferol (VITAMIN D) 1000 UNITS tablet Take 50,000 Units by mouth once a week.     . loperamide (IMODIUM) 2 MG capsule Take 1 capsule (2 mg total) by mouth 4 (four) times daily as needed for  diarrhea or loose stools. (Patient not taking: Reported on 10/24/2018) 6 capsule 0   No current facility-administered medications for this visit.    ROS:   General:  No weight loss, Fever, chills  HEENT: No recent headaches, no nasal bleeding, no visual changes, no sore throat  Neurologic: No dizziness, blackouts, seizures. No recent symptoms of stroke or mini- stroke. No recent episodes of slurred speech, or temporary blindness.  Cardiac: No recent episodes of chest pain/pressure, + shortness of breath at rest.  + shortness of breath with exertion.  Denies history of atrial fibrillation or irregular heartbeat  Vascular: No history of rest pain in feet.  No history of claudication.  No history of non-healing ulcer, No history of DVT   Pulmonary: No home oxygen, no productive cough, no hemoptysis,  No asthma or  wheezing  Musculoskeletal:  [ ]  Arthritis, [ ]  Low back pain,  [ ]  Joint pain  Hematologic:No history of hypercoagulable state.  No history of easy bleeding.  No history of anemia  Gastrointestinal: No hematochezia or melena,  No gastroesophageal reflux, no trouble swallowing  Urinary: [ ]  chronic Kidney disease, [ ]  on HD - [ ]  MWF or [ ]  TTHS, [ ]  Burning with urination, [ ]  Frequent urination, [ ]  Difficulty urinating;   Skin: No rashes  Psychological: No history of anxiety,  No history of depression   Physical Examination  Vitals:   02/14/20 1025  BP: 112/76  Pulse: 87  Resp: 20  Temp: 97.7 F (36.5 C)  SpO2: 99%  Weight: 153 lb 12.8 oz (69.8 kg)  Height: 5\' 10"  (1.778 m)    Body mass index is 22.07 kg/m.  General:  Alert and oriented, no acute distress HEENT: Normal Neck: No JVD Pulmonary: Clear to auscultation bilaterally Cardiac: Regular Rate and Rhythm  Abdomen: Soft, non-tender, non-distended, easily palpable pulsatile mass extending from the epigastrium to the periumbilical region consistent with 5.2 cm abdominal aortic aneurysm Skin: No rash Extremity Pulses:  2+ radial, brachial, femoral, absent dorsalis pedis, 2+ posterior tibial pulses bilaterally Musculoskeletal: No deformity or edema  Neurologic: Upper and lower extremity motor 5/5 and symmetric  DATA:  I reviewed and interpreted the images from the patient's CT scan dated February 13, 2020.  This shows an infrarenal 5.2 cm abdominal aortic aneurysm.  There is no evidence of rupture.  The iliacs are calcified and tortuous.  There is no frank iliac aneurysm.  Renal vein is anterior.  ASSESSMENT: 1.  5.2 cm infrarenal abdominal aortic aneurysm with tortuous calcified iliacs.  Patient currently not very anxious about this and wishes to wait until it reaches 5.5 cm in diameter.  2.  Shortness of breath at rest and sometimes at nighttime while sleeping.  CT scan suggest possible decreased ejection fraction  based on contrast mixing   PLAN: 1.  Follow-up ultrasound abdominal aortic aneurysm and office visit with me in 6 months.  2.  Start aspirin 81 mg once daily and Crestor 10 mg once daily for presence of peripheral arterial disease  3.  We will schedule the patient for cardiology evaluation to further evaluate his ejection fraction based on his shortness of breath and CT scan findings suggestive of decreased ejection fraction.  We will try to get this scheduled within the next 1 to 2 months.  4.  If patient opts for stent graft repair at some point in the future he will most likely need preoperative arteriogram due to tortuous calcified iliac arteries  Ruta Hinds, MD [facility] Office: [phone] Pager: [phone]

## 2020-02-15 ENCOUNTER — Other Ambulatory Visit: Payer: Self-pay | Admitting: *Deleted

## 2020-02-15 DIAGNOSIS — I714 Abdominal aortic aneurysm, without rupture, unspecified: Secondary | ICD-10-CM

## 2020-03-05 NOTE — Progress Notes (Deleted)
Cardiology Office Note:    Date:  03/05/2020   ID:  Sean Lowe, DOB 02-Mar-1956, MRN [id number]  PCP:  Sean Contras, MD  Cardiologist:  No primary care provider on file.  Electrophysiologist:  None   Referring MD: Sean Kerns, MD   No chief complaint on file. ***  History of Present Illness:    Sean Lowe is a 64 y.o. male with a hx of schizophrenia, abdominal aortic aneurysm, tobacco use who is referred by Dr. Darrick Lowe for evaluation of dyspnea.  Past Medical History:  Diagnosis Date  . AAA (abdominal aortic aneurysm) ([facility])   . Anxiety   . Psychiatric diagnosis   . Schizophrenia [facility])     Past Surgical History:  Procedure Laterality Date  . FINGER SURGERY Left    repair from injury    Current Medications: No outpatient medications have been marked as taking for the 03/06/20 encounter (Appointment) with Little Ishikawa, MD.     Allergies:   Patient has no known allergies.   Social History   Socioeconomic History  . Marital status: Single    Spouse name: Not on file  . Number of children: Not on file  . Years of education: Not on file  . Highest education level: Not on file  Occupational History  . Not on file  Tobacco Use  . Smoking status: Current Every Day Smoker    Packs/day: 1.50    Years: 42.00    Pack years: 63.00    Types: Cigarettes  . Smokeless tobacco: Never Used  Substance and Sexual Activity  . Alcohol use: Yes    Alcohol/week: 0.0 standard drinks    Comment: occasional  . Drug use: No  . Sexual activity: Never    Birth control/protection: Abstinence  Other Topics Concern  . Not on file  Social History Narrative  . Not on file   Social Determinants of Health   Financial Resource Strain:   . Difficulty of Paying Living Expenses:   Food Insecurity:   . Worried About Programme researcher, broadcasting/film/video in the Last Year:   . Barista in the Last Year:   Transportation Needs:   . Freight forwarder (Medical):     Marland Kitchen Lack of Transportation (Non-Medical):   Physical Activity:   . Days of Exercise per Week:   . Minutes of Exercise per Session:   Stress:   . Feeling of Stress :   Social Connections:   . Frequency of Communication with Friends and Family:   . Frequency of Social Gatherings with Friends and Family:   . Attends Religious Services:   . Active Member of Clubs or Organizations:   . Attends Banker Meetings:   Marland Kitchen Marital Status:      Family History: The patient's ***family history is not on file.  ROS:   Please see the history of present illness.    *** All other systems reviewed and are negative.  EKGs/Labs/Other Studies Reviewed:    The following studies were reviewed today: ***  EKG:  EKG is *** ordered today.  The ekg ordered today demonstrates ***  Recent Labs: No results found for requested labs within last 8760 hours.  Recent Lipid Panel No results found for: CHOL, TRIG, HDL, CHOLHDL, VLDL, LDLCALC, LDLDIRECT  Physical Exam:    VS:  There were no vitals taken for this visit.    Wt Readings from Last 3 Encounters:  02/14/20 153 lb 12.8 oz (69.8 kg)  06/27/19 144 lb (65.3 kg)  10/24/18 150 lb (68 kg)     GEN: *** Well nourished, well developed in no acute distress HEENT: Normal NECK: No JVD; No carotid bruits LYMPHATICS: No lymphadenopathy CARDIAC: ***RRR, no murmurs, rubs, gallops RESPIRATORY:  Clear to auscultation without rales, wheezing or rhonchi  ABDOMEN: Soft, non-tender, non-distended MUSCULOSKELETAL:  No edema; No deformity  SKIN: Warm and dry NEUROLOGIC:  Alert and oriented x 3 PSYCHIATRIC:  Normal affect   ASSESSMENT:    No diagnosis found. PLAN:    In order of problems listed above:  Dyspnea: will check TTE  PAD: On aspirin 81 mg daily and Crestor 10 mg daily  AAA: Follows with vascular surgery  RTC in***   Medication Adjustments/Labs and Tests Ordered: Current medicines are reviewed at length with the patient today.   Concerns regarding medicines are outlined above.  No orders of the defined types were placed in this encounter.  No orders of the defined types were placed in this encounter.   There are no Patient Instructions on file for this visit.   Signed, Donato Heinz, MD  03/05/2020 10:25 PM    [place]

## 2020-03-06 ENCOUNTER — Ambulatory Visit: Payer: Medicare Other | Admitting: Cardiology

## 2020-04-13 NOTE — Progress Notes (Signed)
Cardiology Office Note   Date:  04/14/2020   ID:  Sean Lowe, DOB [birth year], MRN [id number]  PCP:  Nolene Ebbs, MD    No chief complaint on file.    Wt Readings from Last 3 Encounters:  04/14/20 152 lb (68.9 kg)  02/14/20 153 lb 12.8 oz (69.8 kg)  06/27/19 144 lb (65.3 kg)       History of Present Illness: Sean Lowe is a 64 y.o. male  with AAA, tobacco abuse and schizophrenia who lives in a group home.   History from Dr. Oneida Alar shows: "Aneurysm was initially diagnosed in 2016 with a diameter of 3.8 cm.  He was last seen in July 2020 at that time his aneurysm was 4.9 cm.  He returns today after CT angiogram of the abdomen and pelvis performed earlier this week.  Aneurysm is now 5.2 cm in diameter.  Other medical problems include schizophrenia.  He does live in a group home.  He continues to smoke about a pack of cigarettes per day."  3/21 CT scan showed: "Mixing of the arterial phase contrast bolus in the aneurysmal portion of the aorta with relatively poor distal opacification suggests either poor cardiac output, or suboptimal timing of the arterial bolus."  He was referred here to have his EF evaluated and to be checked for CHF.   Denies : Chest pain. Dizziness. Leg edema. Nitroglycerin use. Orthopnea. Palpitations. Paroxysmal nocturnal dyspnea.  Syncope.   He has some leg pain with walking, relieved by rest.     Past Medical History:  Diagnosis Date  . AAA (abdominal aortic aneurysm) ([place])   . Anxiety   . Psychiatric diagnosis   . Schizophrenia [facility])     Past Surgical History:  Procedure Laterality Date  . FINGER SURGERY Left    repair from injury     Current Outpatient Medications  Medication Sig Dispense Refill  . acetaminophen (TYLENOL) 325 MG tablet Take 650 mg by mouth every 4 (four) hours as needed. Reported on 12/25/2015    . aspirin EC 81 MG tablet Take 1 tablet (81 mg total) by mouth daily. 150 tablet 6  . fluPHENAZine  (PROLIXIN) 2.5 MG tablet Take 7.5 mg by mouth daily.     . rosuvastatin (CRESTOR) 10 MG tablet Take 1 tablet (10 mg total) by mouth daily. 30 tablet 11   No current facility-administered medications for this visit.    Allergies:   Patient has no known allergies.    Social History:  The patient  reports that he has been smoking cigarettes. He has a 63.00 pack-year smoking history. He has never used smokeless tobacco. He reports current alcohol use. He reports that he does not use drugs.   Family History:  The patient's family history includes Stroke in his father.    ROS:  Please see the history of present illness.   Otherwise, review of systems are positive for difficulty stopping smoking.   All other systems are reviewed and negative.    PHYSICAL EXAM: VS:  BP 110/72   Pulse 68   Ht 5\' 10"  (1.778 m)   Wt 152 lb (68.9 kg)   SpO2 99%   BMI 21.81 kg/m  , BMI Body mass index is 21.81 kg/m. GEN: Well nourished, well developed, in no acute distress  HEENT: normal  Neck: no JVD, carotid bruits, or masses Cardiac: RRR; no murmurs, rubs, or gallops,no edema  Respiratory:  clear to auscultation bilaterally, normal work of breathing GI: soft,  nontender, nondistended, + BS MS: no deformity or atrophy  Skin: warm and dry, no rash Neuro:  Strength and sensation are intact Psych: euthymic mood, full affect   EKG:   The ekg ordered today demonstrates NSR, no ST segment changes   Recent Labs: No results found for requested labs within last 8760 hours.   Lipid Panel No results found for: CHOL, TRIG, HDL, CHOLHDL, VLDL, LDLCALC, LDLDIRECT   Other studies Reviewed: Additional studies/ records that were reviewed today with results demonstrating: LDL 89 in 2016 .   ASSESSMENT AND PLAN:  1. AAA/PAD: Followed with vascular surgery.  Some sx of claudication.  May need LE arterial Doppler.  2. Tobacco abuse: He is thinking about quitting.  COnsider nicotine patch.  He will think about  it.  3. Check echo due to concern for CHF based on CT, along with shortness of breath.   Current medicines are reviewed at length with the patient today.  The patient concerns regarding his medicines were addressed.  The following changes have been made:  No change  Labs/ tests ordered today include:  No orders of the defined types were placed in this encounter.   Recommend 150 minutes/week of aerobic exercise Low fat, low carb, high fiber diet recommended  Disposition:   FU for echo   Signed, Lance Muss, MD  04/14/2020 3:08 PM    [facility] [address] [place], [place], [place]  [address] Phone: [phone]; Fax: [phone]

## 2020-04-14 ENCOUNTER — Other Ambulatory Visit: Payer: Self-pay

## 2020-04-14 ENCOUNTER — Encounter: Payer: Self-pay | Admitting: Interventional Cardiology

## 2020-04-14 ENCOUNTER — Ambulatory Visit (INDEPENDENT_AMBULATORY_CARE_PROVIDER_SITE_OTHER): Payer: Medicare Other | Admitting: Interventional Cardiology

## 2020-04-14 VITALS — BP 110/72 | HR 68 | Ht 70.0 in | Wt 152.0 lb

## 2020-04-14 DIAGNOSIS — I739 Peripheral vascular disease, unspecified: Secondary | ICD-10-CM | POA: Diagnosis not present

## 2020-04-14 DIAGNOSIS — I714 Abdominal aortic aneurysm, without rupture, unspecified: Secondary | ICD-10-CM

## 2020-04-14 DIAGNOSIS — Z720 Tobacco use: Secondary | ICD-10-CM

## 2020-04-14 DIAGNOSIS — R0602 Shortness of breath: Secondary | ICD-10-CM

## 2020-04-14 NOTE — Patient Instructions (Signed)
Medication Instructions:  Your physician recommends that you continue on your current medications as directed. Please refer to the Current Medication list given to you today.  *If you need a refill on your cardiac medications before your next appointment, please call your pharmacy*   Lab Work: None ordered  If you have labs (blood work) drawn today and your tests are completely normal, you will receive your results only by: . MyChart Message (if you have MyChart) OR . A paper copy in the mail If you have any lab test that is abnormal or we need to change your treatment, we will call you to review the results.   Testing/Procedures: Your physician has requested that you have an echocardiogram. Echocardiography is a painless test that uses sound waves to create images of your heart. It provides your doctor with information about the size and shape of your heart and how well your heart's chambers and valves are working. This procedure takes approximately one hour. There are no restrictions for this procedure.  Follow-Up: Based on test results  Other Instructions None  

## 2020-05-05 ENCOUNTER — Other Ambulatory Visit (HOSPITAL_COMMUNITY): Payer: Medicare Other

## 2020-05-30 ENCOUNTER — Other Ambulatory Visit (HOSPITAL_COMMUNITY): Payer: Medicare Other

## 2020-06-06 ENCOUNTER — Telehealth (HOSPITAL_COMMUNITY): Payer: Self-pay | Admitting: Interventional Cardiology

## 2020-06-06 NOTE — Telephone Encounter (Signed)
I called to reschedule the echocardiogram that patient has NO SHOWED on 05/05/20 and 05/30/20.  He lives in a group home and the caregiver states that they have arranged transportation for him both visits and that patient gets "gone" when they arrive to take him and he told them he does not want to have it.  We did not reschedule at this time.  Order will be removed from the WQ and if patient decides to have in the future we can reinstate order or create new one.

## 2020-09-11 ENCOUNTER — Ambulatory Visit: Payer: Medicare Other | Attending: Critical Care Medicine

## 2020-09-11 DIAGNOSIS — Z23 Encounter for immunization: Secondary | ICD-10-CM

## 2020-09-11 NOTE — Progress Notes (Signed)
   Covid-19 Vaccination Clinic  Name:  Sean Lowe    MRN: [id number] DOB: 29-May-1956  09/11/2020  Sean Lowe was observed post Covid-19 immunization for 15 minutes without incident. He was provided with Vaccine Information Sheet and instruction to access the V-Safe system.   Sean Lowe was instructed to call 911 with any severe reactions post vaccine: Marland Kitchen Difficulty breathing  . Swelling of face and throat  . A fast heartbeat  . A bad rash all over body  . Dizziness and weakness

## 2020-11-12 ENCOUNTER — Other Ambulatory Visit: Payer: Self-pay

## 2020-11-12 ENCOUNTER — Encounter (HOSPITAL_COMMUNITY): Payer: Self-pay | Admitting: Emergency Medicine

## 2020-11-12 ENCOUNTER — Emergency Department (HOSPITAL_COMMUNITY)
Admission: EM | Admit: 2020-11-12 | Discharge: 2020-11-13 | Disposition: A | Discharge status: Home / Self Care | Payer: Medicare Other | Attending: Emergency Medicine | Admitting: Emergency Medicine

## 2020-11-12 DIAGNOSIS — S02612A Fracture of condylar process of left mandible, initial encounter for closed fracture: Secondary | ICD-10-CM | POA: Insufficient documentation

## 2020-11-12 DIAGNOSIS — Y9389 Activity, other specified: Secondary | ICD-10-CM | POA: Diagnosis not present

## 2020-11-12 DIAGNOSIS — W19XXXA Unspecified fall, initial encounter: Secondary | ICD-10-CM

## 2020-11-12 DIAGNOSIS — S0181XA Laceration without foreign body of other part of head, initial encounter: Secondary | ICD-10-CM | POA: Insufficient documentation

## 2020-11-12 DIAGNOSIS — F17210 Nicotine dependence, cigarettes, uncomplicated: Secondary | ICD-10-CM | POA: Diagnosis not present

## 2020-11-12 DIAGNOSIS — W010XXA Fall on same level from slipping, tripping and stumbling without subsequent striking against object, initial encounter: Secondary | ICD-10-CM | POA: Insufficient documentation

## 2020-11-12 DIAGNOSIS — Z7982 Long term (current) use of aspirin: Secondary | ICD-10-CM | POA: Insufficient documentation

## 2020-11-12 DIAGNOSIS — Y92099 Unspecified place in other non-institutional residence as the place of occurrence of the external cause: Secondary | ICD-10-CM | POA: Insufficient documentation

## 2020-11-12 DIAGNOSIS — Z23 Encounter for immunization: Secondary | ICD-10-CM | POA: Diagnosis not present

## 2020-11-12 DIAGNOSIS — Z20822 Contact with and (suspected) exposure to covid-19: Secondary | ICD-10-CM | POA: Insufficient documentation

## 2020-11-12 DIAGNOSIS — S0993XA Unspecified injury of face, initial encounter: Secondary | ICD-10-CM | POA: Diagnosis present

## 2020-11-12 DIAGNOSIS — S02642A Fracture of ramus of left mandible, initial encounter for closed fracture: Secondary | ICD-10-CM

## 2020-11-12 MED ORDER — BACITRACIN ZINC 500 UNIT/GM EX OINT
TOPICAL_OINTMENT | Freq: Once | CUTANEOUS | Status: AC
  Filled 2020-11-12: qty 0.9

## 2020-11-12 MED ORDER — TETANUS-DIPHTH-ACELL PERTUSSIS 5-2.5-18.5 LF-MCG/0.5 IM SUSY
0.5000 mL | PREFILLED_SYRINGE | Freq: Once | INTRAMUSCULAR | Status: AC
  Administered 2020-11-13: 02:00:00 0.5 mL via INTRAMUSCULAR
  Filled 2020-11-12: qty 0.5

## 2020-11-12 MED ORDER — LIDOCAINE-EPINEPHRINE 2 %-1:100000 IJ SOLN
20.0000 mL | Freq: Once | INTRAMUSCULAR | Status: AC
  Administered 2020-11-13: 02:00:00 20 mL via INTRADERMAL
  Filled 2020-11-12: qty 1

## 2020-11-12 NOTE — ED Triage Notes (Signed)
64 yo male biba status post fall. Pt was taking trash out this evening and tripped and hit his chin on the pavement. Pt has 1/2 lac on his chin, per EMS. Bleeding is controled at this time. Pt is complaining of only jaw pain at this time, per EMS. Pt denies denies neck and back pain, LOC. Pt is ambulatory without incident.   Vitals 118/80 Hr 80 rr 16

## 2020-11-13 ENCOUNTER — Emergency Department (HOSPITAL_COMMUNITY): Payer: Medicare Other

## 2020-11-13 DIAGNOSIS — S0181XA Laceration without foreign body of other part of head, initial encounter: Secondary | ICD-10-CM | POA: Diagnosis not present

## 2020-11-13 LAB — CBC WITH DIFFERENTIAL/PLATELET
Abs Immature Granulocytes: 0.02 10*3/uL (ref 0.00–0.07)
Basophils Absolute: 0.1 10*3/uL (ref 0.0–0.1)
Basophils Relative: 1 %
Eosinophils Absolute: 0.3 10*3/uL (ref 0.0–0.5)
Eosinophils Relative: 3 %
HCT: 39.4 % (ref 39.0–52.0)
Hemoglobin: 13 g/dL (ref 13.0–17.0)
Immature Granulocytes: 0 %
Lymphocytes Relative: 26 %
Lymphs Abs: 2.4 10*3/uL (ref 0.7–4.0)
MCH: 31.7 pg (ref 26.0–34.0)
MCHC: 33 g/dL (ref 30.0–36.0)
MCV: 96.1 fL (ref 80.0–100.0)
Monocytes Absolute: 0.7 10*3/uL (ref 0.1–1.0)
Monocytes Relative: 8 %
Neutro Abs: 5.8 10*3/uL (ref 1.7–7.7)
Neutrophils Relative %: 62 %
Platelets: 159 10*3/uL (ref 150–400)
RBC: 4.1 MIL/uL — ABNORMAL LOW (ref 4.22–5.81)
RDW: 12.6 % (ref 11.5–15.5)
WBC: 9.2 10*3/uL (ref 4.0–10.5)
nRBC: 0 % (ref 0.0–0.2)

## 2020-11-13 LAB — BASIC METABOLIC PANEL
Anion gap: 10 (ref 5–15)
BUN: 13 mg/dL (ref 8–23)
CO2: 24 mmol/L (ref 22–32)
Calcium: 9.4 mg/dL (ref 8.9–10.3)
Chloride: 100 mmol/L (ref 98–111)
Creatinine, Ser: 1.06 mg/dL (ref 0.61–1.24)
GFR, Estimated: >60 mL/min (ref >60)
Glucose, Bld: 114 mg/dL — ABNORMAL HIGH (ref 70–99)
Potassium: 3.9 mmol/L (ref 3.5–5.1)
Sodium: 134 mmol/L — ABNORMAL LOW (ref 135–145)

## 2020-11-13 LAB — RESP PANEL BY RT-PCR (FLU A&B, COVID) ARPGX2
Influenza A by PCR: NEGATIVE
Influenza B by PCR: NEGATIVE
SARS Coronavirus 2 by RT PCR: NEGATIVE

## 2020-11-13 MED ORDER — BACITRACIN ZINC 500 UNIT/GM EX OINT: 1.0000 "application " | TOPICAL_OINTMENT | Freq: Two times a day (BID) | CUTANEOUS | 0 refills | Status: DC

## 2020-11-13 MED ORDER — DOCUSATE SODIUM 100 MG PO CAPS: 100.0000 mg | ORAL_CAPSULE | Freq: Two times a day (BID) | ORAL | 0 refills | Status: DC

## 2020-11-13 MED ORDER — OXYCODONE-ACETAMINOPHEN 5-325 MG PO TABS: 1.0000 | ORAL_TABLET | ORAL | 0 refills | Status: DC | PRN

## 2020-11-13 MED ORDER — ONDANSETRON 4 MG PO TBDP: 4.0000 mg | ORAL_TABLET | Freq: Three times a day (TID) | ORAL | 0 refills | Status: DC | PRN

## 2020-11-13 MED ORDER — IBUPROFEN 600 MG PO TABS: 600.0000 mg | ORAL_TABLET | Freq: Four times a day (QID) | ORAL | 0 refills | Status: DC | PRN

## 2020-11-13 NOTE — ED Notes (Signed)
Attempted to call Rosey Bath (group home supervisor) to confirm she will be picking up patient to take him back to group home. Unable to make contact.

## 2020-11-13 NOTE — ED Notes (Signed)
This RN called patient contact Rosey Bath again and left voice message.

## 2020-11-13 NOTE — Discharge Summary (Signed)
Patient is from [facility]. They have been notified that patient is being discharged.

## 2020-11-13 NOTE — ED Provider Notes (Signed)
TIME SEEN: 12:00 AM  CHIEF COMPLAINT: Fall  HPI: Patient is a 65 year old male with history of schizophrenia who lives at [facility] who presents to the emergency department after he tripped over his own shoes and fell while taking the trash cans out today.  Has small laceration to the chin.  Complaining of left jaw pain.  No headache.  No neck or back pain.  No numbness or weakness.  No preceding symptoms that led to him falling.  No chest pain, shortness of breath, abdominal pain.  Not on blood thinners.  Unsure of last tetanus vaccination.  [facility] [phone])  ROS: See HPI Constitutional: no fever  Eyes: no drainage  ENT: no runny nose   Cardiovascular:  no chest pain  Resp: no SOB  GI: no vomiting GU: no dysuria Integumentary: no rash  Allergy: no hives  Musculoskeletal: no leg swelling  Neurological: no slurred speech ROS otherwise negative  PAST MEDICAL HISTORY/PAST SURGICAL HISTORY:  Past Medical History:  Diagnosis Date  . AAA (abdominal aortic aneurysm) (HCC)   . Anxiety   . Psychiatric diagnosis   . Schizophrenia (HCC)     MEDICATIONS:  Prior to Admission medications   Medication Sig Start Date End Date Taking? Authorizing Provider  acetaminophen (TYLENOL) 325 MG tablet Take 650 mg by mouth every 4 (four) hours as needed. Reported on 12/25/2015    [provider]  aspirin EC 81 MG tablet Take 1 tablet (81 mg total) by mouth daily. 02/14/20   Sherren Kerns, MD  fluPHENAZine (PROLIXIN) 2.5 MG tablet Take 7.5 mg by mouth daily.     [provider]  rosuvastatin (CRESTOR) 10 MG tablet Take 1 tablet (10 mg total) by mouth daily. 02/14/20   Sherren Kerns, MD    ALLERGIES:  No Known Allergies  SOCIAL HISTORY:  Social History   Tobacco Use  . Smoking status: Current Every Day Smoker    Packs/day: 1.50    Years: 42.00    Pack years: 63.00    Types: Cigarettes  . Smokeless tobacco: Never Used  Substance Use Topics   . Alcohol use: Yes    Alcohol/week: 0.0 standard drinks    Comment: occasional    FAMILY HISTORY: Family History  Problem Relation Age of Onset  . Stroke Father     EXAM: BP 135/73 (BP Location: Left Arm)   Pulse 80   Temp 98.3 F (36.8 C) (Oral)   Resp 16   SpO2 100%  CONSTITUTIONAL: Alert and oriented and responds appropriately to questions. Well-appearing; well-nourished; GCS 15 HEAD: Normocephalic; 3 cm laceration under the chin that is superficial in nature with no foreign body EYES: Conjunctivae clear, PERRL, EOMI ENT: normal nose; no rhinorrhea; moist mucous membranes; pharynx without lesions noted; no septal hematoma; patient is tender to palpation over the left jaw, no malocclusion, no dental injury noted NECK: Supple, no meningismus, no LAD; no midline spinal tenderness, step-off or deformity; trachea midline CARD: RRR; S1 and S2 appreciated; no murmurs, no clicks, no rubs, no gallops RESP: Normal chest excursion without splinting or tachypnea; breath sounds clear and equal bilaterally; no wheezes, no rhonchi, no rales; no hypoxia or respiratory distress CHEST:  chest wall stable, no crepitus or ecchymosis or deformity, nontender to palpation; no flail chest ABD/GI: Normal bowel sounds; non-distended; soft, non-tender, no rebound, no guarding; no ecchymosis or other lesions noted PELVIS:  stable, nontender to palpation BACK:  The back appears normal and is non-tender to palpation,  there is no CVA tenderness; no midline spinal tenderness, step-off or deformity EXT: Normal ROM in all joints; non-tender to palpation; no edema; normal capillary refill; no cyanosis, no bony tenderness or bony deformity of patient's extremities, no joint effusion, compartments are soft, extremities are warm and well-perfused, no ecchymosis SKIN: Normal color for age and race; warm NEURO: Moves all extremities equally, normal sensation diffusely, cranial nerves II through XII intact, normal  speech PSYCH: The patient's mood and manner are appropriate. Grooming and personal hygiene are appropriate.  MEDICAL DECISION MAKING: Patient here after mechanical fall.  Will obtain CT imaging of his head and face.  Will repair laceration.  Will update tetanus vaccination.  ED PROGRESS: CT concerning for fracture of the left condylar process in the proximal left ramus.  He has no malocclusion on exam.  I did discuss the case with ENT on-call, Dr. Marene Lenz.  Appreciate ENT help.  She would like to see the patient in her office in the next 1 to 2 days.  Recommended cold compresses several times a day to his left jaw.  Recommended soft diet for the next several days.  Recommend suture removal in 1 week.  Discussed wound care instructions.  I have spoken to [facility] at the group home and we have discussed these recommendations.  I will also send a prescription of pain medication, bacitracin, Colace to his pharmacy as well.  Patient has no one to pick him up from the emergency department at this time.  Attempting to get in touch with the group home supervisor in the morning.  At this time, I do not feel there is any life-threatening condition present. I have reviewed, interpreted and discussed all results (EKG, imaging, lab, urine as appropriate) and exam findings with patient/family. I have reviewed nursing notes and appropriate previous records.  I feel the patient is safe to be discharged home without further emergent workup and can continue workup as an outpatient as needed. Discussed usual and customary return precautions. Patient/family verbalize understanding and are comfortable with this plan.  Outpatient follow-up has been provided as needed. All questions have been answered.  LACERATION REPAIR Performed by: Rochele Raring Authorized by: Rochele Raring Consent: Verbal consent obtained. Risks and benefits: risks, benefits and alternatives were discussed Consent given by: patient Patient identity  confirmed: provided demographic data Prepped and Draped in normal sterile fashion Wound explored  Laceration Location: Chin  Laceration Length: 3 cm  No Foreign Bodies seen or palpated  Anesthesia: local infiltration  Local anesthetic: lidocaine 2% with epinephrine  Anesthetic total: 4 ml  Irrigation method: syringe Amount of cleaning: standard  Skin closure: Superficial  Number of sutures: 3  Technique: Area anesthetized using lidocaine 2% with epinephrine. Wound irrigated copiously with sterile saline. Wound then cleaned with Betadine and draped in sterile fashion. Wound closed using 3 simple interrupted sutures with 4-0 Prolene.  Bacitracin and sterile dressing applied. Good wound approximation and hemostasis achieved.   Patient tolerance: Patient tolerated the procedure well with no immediate complications.   Sean Lowe was evaluated in Emergency Department on 11/13/2020 for the symptoms described in the history of present illness. He was evaluated in the context of the global COVID-19 pandemic, which necessitated consideration that the patient might be at risk for infection with the SARS-CoV-2 virus that causes COVID-19. Institutional protocols and algorithms that pertain to the evaluation of patients at risk for COVID-19 are in a state of rapid change based on information released by regulatory bodies including the  CDC and federal and [employer]. These policies and algorithms were followed during the patient's care in the ED.       Sean Lowe, Layla Maw, DO 11/13/20 [phone]

## 2020-11-13 NOTE — Discharge Instructions (Addendum)
You may alternate Tylenol 1000 mg every 6 hours as needed for pain, fever and Ibuprofen 800 mg every 8 hours as needed for pain, fever.  Please take Ibuprofen with food.  Do not take more than 4000 mg of Tylenol (acetaminophen) in a 24 hour period.  You may take oxycodone as needed for pain.  You are being provided a prescription for opiates (also known as narcotics) for pain control.  Opiates can be addictive and should only be used when absolutely necessary for pain control when other alternatives do not work.  We recommend you only use them for the recommended amount of time and only as prescribed.  Please do not take with other sedative medications or alcohol.  Please do not drive, operate machinery, make important decisions while taking opiates.  Please note that these medications can be addictive and have high abuse potential.  Patients can become addicted to narcotics after only taking them for a few days.  Please keep these medications locked away from children, teenagers or any family members with history of substance abuse.  Narcotic pain medicine may also make you constipated.  You may use over-the-counter medications such as MiraLAX, Colace to prevent constipation.  If you become constipated you may use over-the-counter enemas as needed.  Itching and nausea are common side effects of narcotic pain medication.  If you develop uncontrolled vomiting or a rash, please stop these medications.  Please keep your chin wound clean and dry.  You may clean it gently with warm soap and water 1-2 times a day and then apply bacitracin and a dressing.  Your sutures will need to be removed in 1 week.  Please do not shave your face until your sutures are out and the wound has completely healed.   We recommend ice packs/compresses to the left jaw for 15-20 minutes at a time multiple times a day.  We recommend a s

## 2020-11-13 NOTE — ED Notes (Signed)
Rosey Bath (group home supervisor) to arrive in 15 minutes to transport patient back to group home.

## 2020-11-20 ENCOUNTER — Encounter (HOSPITAL_COMMUNITY): Payer: Self-pay

## 2020-11-20 ENCOUNTER — Emergency Department (HOSPITAL_COMMUNITY)
Admission: EM | Admit: 2020-11-20 | Discharge: 2020-11-20 | Disposition: A | Discharge status: Home / Self Care | Payer: Medicare Other | Attending: Emergency Medicine | Admitting: Emergency Medicine

## 2020-11-20 ENCOUNTER — Other Ambulatory Visit: Payer: Self-pay

## 2020-11-20 DIAGNOSIS — F17210 Nicotine dependence, cigarettes, uncomplicated: Secondary | ICD-10-CM | POA: Insufficient documentation

## 2020-11-20 DIAGNOSIS — Z7982 Long term (current) use of aspirin: Secondary | ICD-10-CM | POA: Insufficient documentation

## 2020-11-20 DIAGNOSIS — Z4802 Encounter for removal of sutures: Secondary | ICD-10-CM | POA: Diagnosis present

## 2020-11-20 NOTE — Discharge Instructions (Addendum)
  Return immediately back to the ER if:  Your symptoms worsen within the next 12-24 hours. You develop new symptoms such as new fevers, persistent vomiting, new pain, shortness of breath, or new weakness or numbness, or if you have any other concerns.  

## 2020-11-20 NOTE — ED Triage Notes (Signed)
Patient here to have sutures removed from under his chin.

## 2020-11-20 NOTE — ED Provider Notes (Signed)
[facility]-EMERGENCY DEPT Provider Note   CSN: [id number] Arrival date & time: 11/20/20  7510     History Chief Complaint  Patient presents with  . Suture / Staple Removal    Kendan Cornforth is a 64 y.o. male.  Patient presents with chief complaint of suture removal.  He states he sustained a laceration to his chin ago.  Is here for suture removal.  Denies other illnesses no fever no cough no vomiting or diarrhea.        Past Medical History:  Diagnosis Date  . AAA (abdominal aortic aneurysm) (HCC)   . Anxiety   . Psychiatric diagnosis   . Schizophrenia [facility])     Patient Active Problem List   Diagnosis Date Noted  . AAA (abdominal aortic aneurysm) without rupture (HCC) 12/25/2015  . Paranoid schizophrenia (HCC) 04/19/2014    Past Surgical History:  Procedure Laterality Date  . FINGER SURGERY Left    repair from injury       Family History  Problem Relation Age of Onset  . Stroke Father     Social History   Tobacco Use  . Smoking status: Current Every Day Smoker    Packs/day: 1.50    Years: 42.00    Pack years: 63.00    Types: Cigarettes  . Smokeless tobacco: Never Used  Vaping Use  . Vaping Use: Never used  Substance Use Topics  . Alcohol use: Yes    Alcohol/week: 0.0 standard drinks    Comment: occasional  . Drug use: No    Home Medications Prior to Admission medications   Medication Sig Start Date End Date Taking? Authorizing Provider  acetaminophen (TYLENOL) 325 MG tablet Take 650 mg by mouth every 4 (four) hours as needed. Reported on 12/25/2015    [provider]  aspirin EC 81 MG tablet Take 1 tablet (81 mg total) by mouth daily. 02/14/20   Sherren Kerns, MD  bacitracin ointment Apply 1 application topically 2 (two) times daily. 11/13/20   Ward, Layla Maw, DO  docusate sodium (COLACE) 100 MG capsule Take 1 capsule (100 mg total) by mouth every 12 (twelve) hours. Take while taking narcotic pain  medication 11/13/20   Ward, Layla Maw, DO  fluPHENAZine (PROLIXIN) 2.5 MG tablet Take 7.5 mg by mouth daily.     [provider]  ibuprofen (ADVIL) 600 MG tablet Take 1 tablet (600 mg total) by mouth every 6 (six) hours as needed. 11/13/20   Ward, Layla Maw, DO  ondansetron (ZOFRAN ODT) 4 MG disintegrating tablet Take 1 tablet (4 mg total) by mouth every 8 (eight) hours as needed for nausea or vomiting. 11/13/20   Ward, Layla Maw, DO  oxyCODONE-acetaminophen (PERCOCET/ROXICET) 5-325 MG tablet Take 1 tablet by mouth every 4 (four) hours as needed. 11/13/20   Ward, Layla Maw, DO  rosuvastatin (CRESTOR) 10 MG tablet Take 1 tablet (10 mg total) by mouth daily. 02/14/20   Sherren Kerns, MD    Allergies    Patient has no known allergies.  Review of Systems   Review of Systems  Constitutional: Negative for fever.  HENT: Negative for ear pain and sore throat.   Eyes: Negative for pain.  Respiratory: Negative for cough.   Cardiovascular: Negative for chest pain.  Gastrointestinal: Negative for abdominal pain.  Genitourinary: Negative for flank pain.  Musculoskeletal: Negative for back pain.  Skin: Negative for color change and rash.  Neurological: Negative for syncope.  All other systems reviewed and  are negative.   Physical Exam Updated Vital Signs BP (!) 100/52 (BP Location: Right Arm)   Pulse 79   Temp 97.9 F (36.6 C) (Oral)   Resp 17   Ht 5\' 10"  (1.778 m)   Wt 68 kg   SpO2 100%   BMI 21.52 kg/m   Physical Exam Constitutional:      General: He is not in acute distress.    Appearance: He is well-developed.  HENT:     Head: Normocephalic.     Mouth/Throat:     Mouth: Mucous membranes are moist.  Cardiovascular:     Rate and Rhythm: Normal rate.  Pulmonary:     Effort: Pulmonary effort is normal.  Abdominal:     Palpations: Abdomen is soft.  Musculoskeletal:     Right lower leg: No edema.     Left lower leg: No edema.  Skin:    General: Skin is warm.      Capillary Refill: Capillary refill takes less than 2 seconds.     Comments: Well-healing laceration on the patient's chin.  3 sutures in place.  No evidence of cellulitis or purulent drainage or erythema.  Neurological:     General: No focal deficit present.     Mental Status: He is alert.     ED Results / Procedures / Treatments   Labs (all labs ordered are listed, but only abnormal results are displayed) Labs Reviewed - No data to display  EKG None  Radiology No results found.  Procedures Procedures (including critical care time)  Medications Ordered in ED Medications - No data to display  ED Course  I have reviewed the triage vital signs and the nursing notes.  Pertinent labs & imaging results that were available during my care of the patient were reviewed by me and considered in my medical decision making (see chart for details).    MDM Rules/Calculators/A&P                          3 sutures removed myself.  Patient tolerated procedure well.  Minimal bleeding noted.  Recommend outpatient follow-up with his doctor within the week advised immediate return if he has fevers redness signs of infection or any additional concerns.   Final Clinical Impression(s) / ED Diagnoses Final diagnoses:  Visit for suture removal    Rx / DC Orders ED Discharge Orders    None       , MD 11/20/20 1057

## 2020-12-29 IMAGING — CT CT HEAD W/O CM
3 series · 15 of 47 positions shown, 18 images · non-contrast
Comparison: None.

CLINICAL DATA: Fall

EXAM:
CT HEAD WITHOUT CONTRAST
CT MAXILLOFACIAL WITHOUT CONTRAST
TECHNIQUE: Multidetector CT imaging of the head and maxillofacial structures
were performed using the standard protocol without intravenous
contrast. Multiplanar CT image reconstructions of the maxillofacial
structures were also generated.

[Series 3: head wo · axial · 0.47mm/px · z∈[-34,+96]mm · 9 of 32 slices shown, 12 images]
[im 3/32  brain]
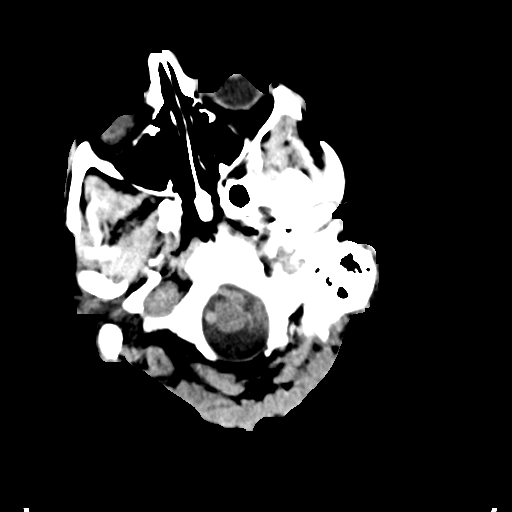
[im 3/32  bone]
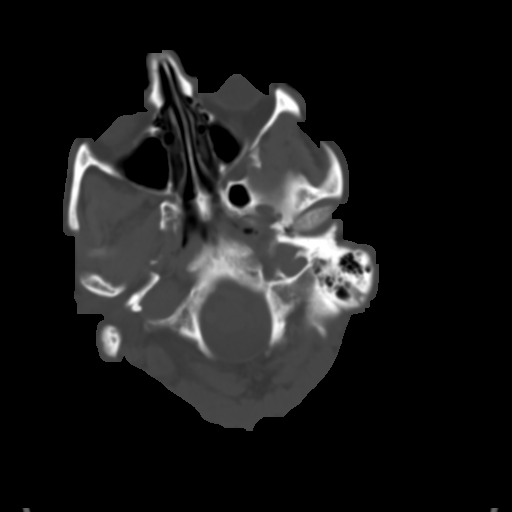
[im 6/32  brain]
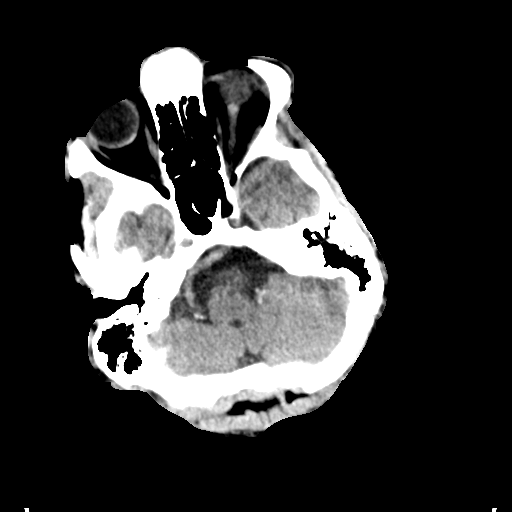
[im 9/32  brain]
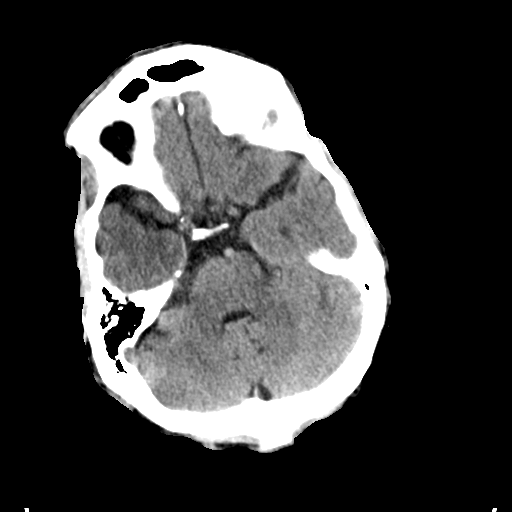
[im 12/32  brain]
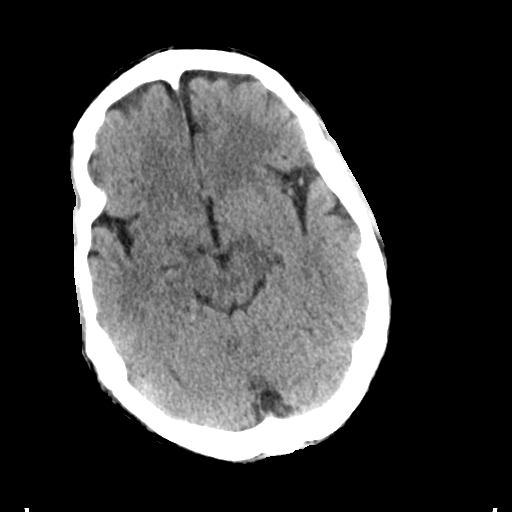
[im 17/32  brain]
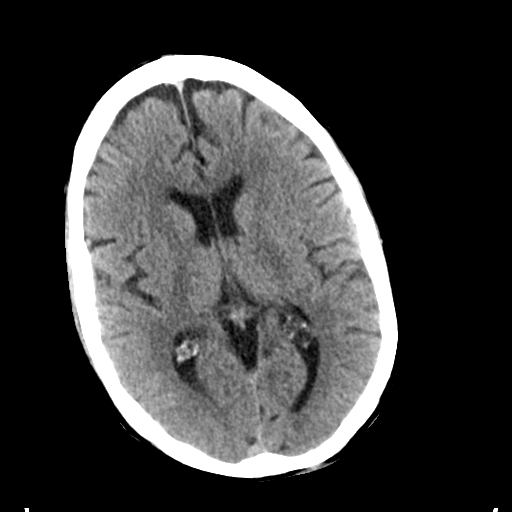
[im 17/32  bone]
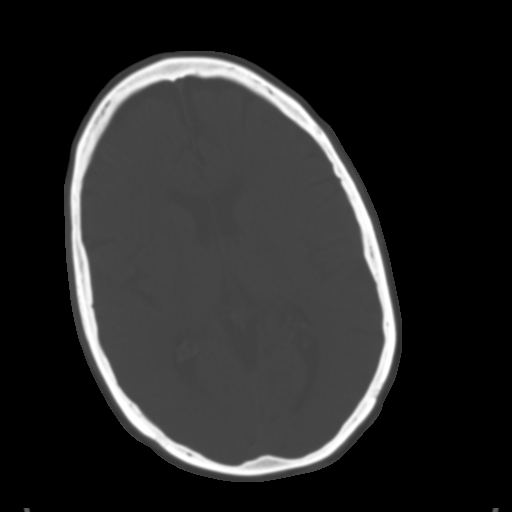
[im 20/32  brain]
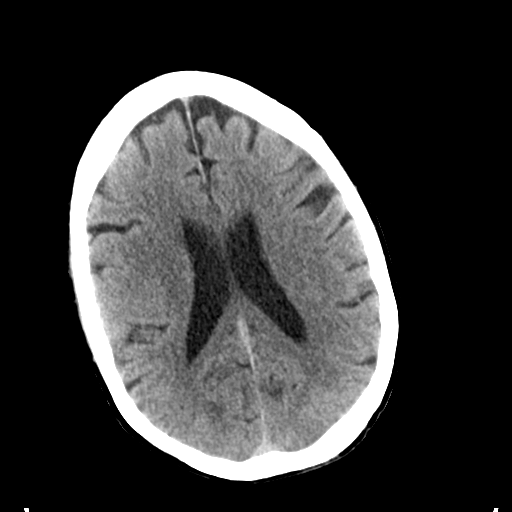
[im 23/32  brain]
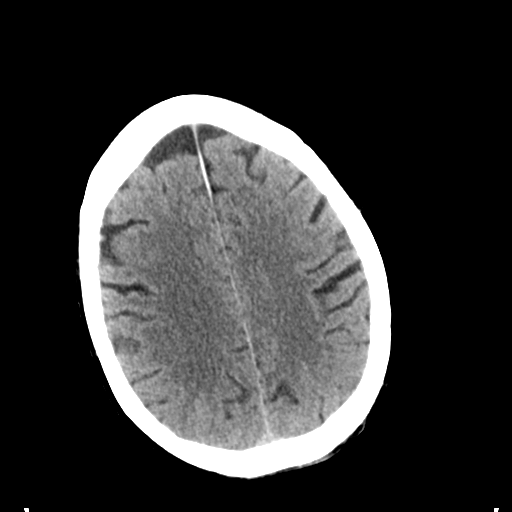
[im 26/32  brain]
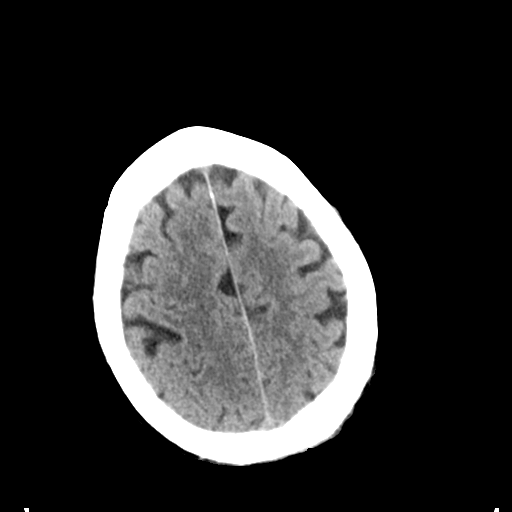
[im 29/32  brain]
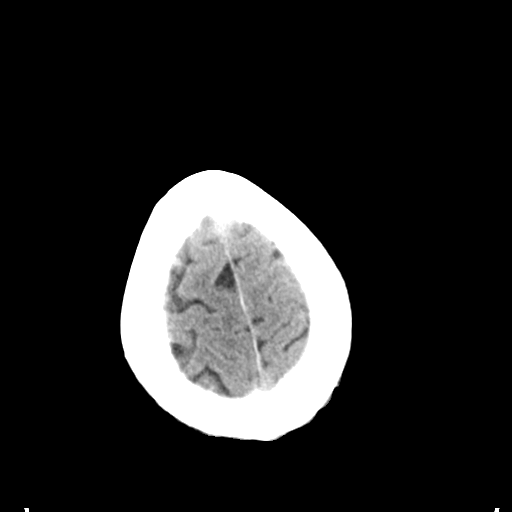
[im 29/32  bone]
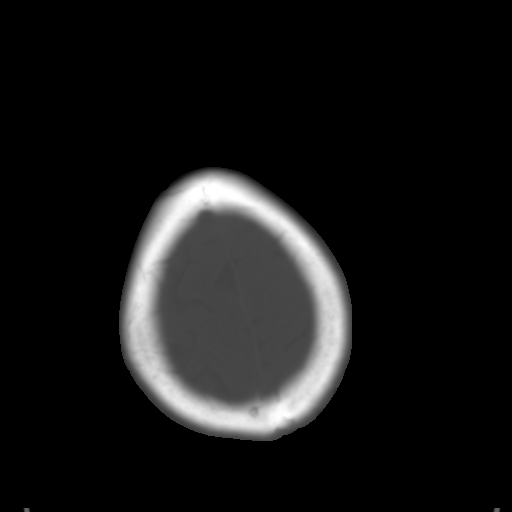

[Series 5: coronal soft tissue · coronal · 0.33mm/px · 3 of 70 slices shown]
[im 24/70  brain]
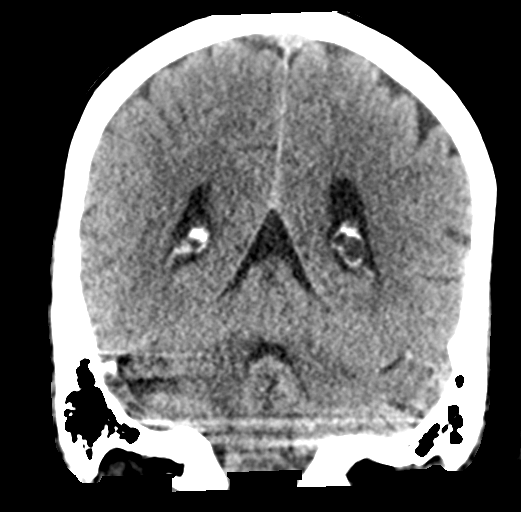
[im 31/70  brain]
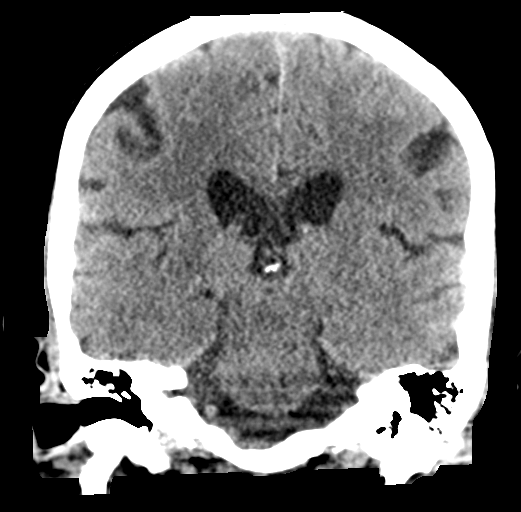
[im 39/70  brain]
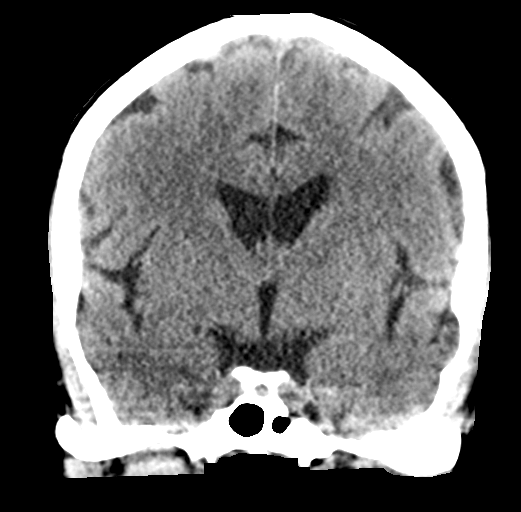

[Series 6: sagittal soft tissue · sagittal · 0.32mm/px · 3 of 52 slices shown]
[im 18/52  brain]
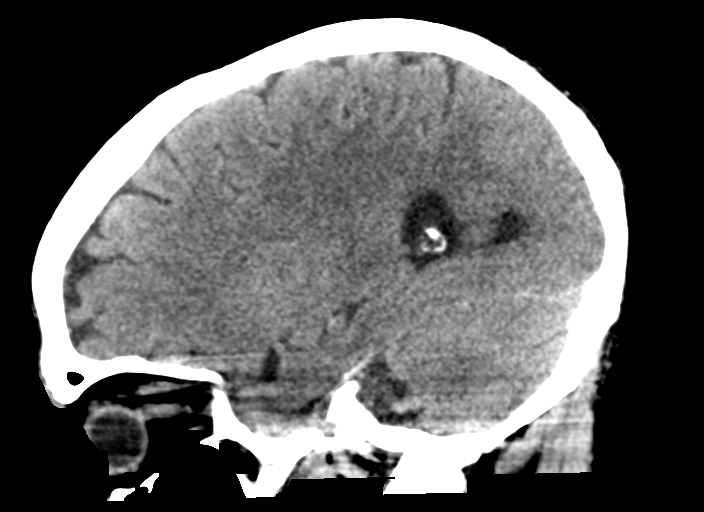
[im 26/52  brain]
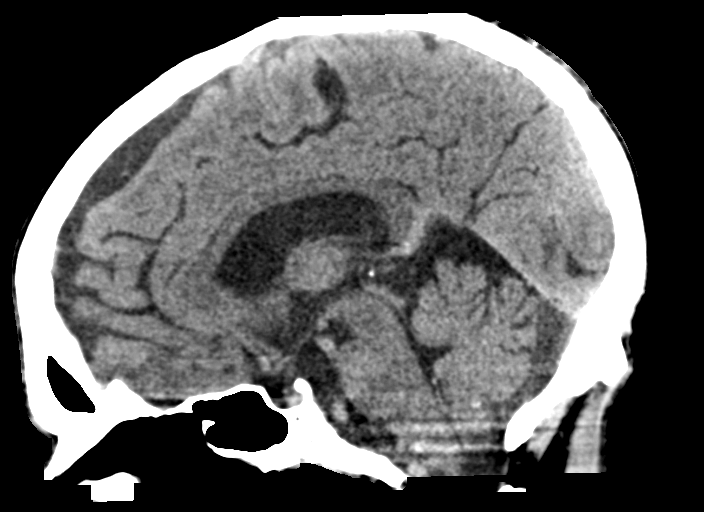
[im 35/52  brain]
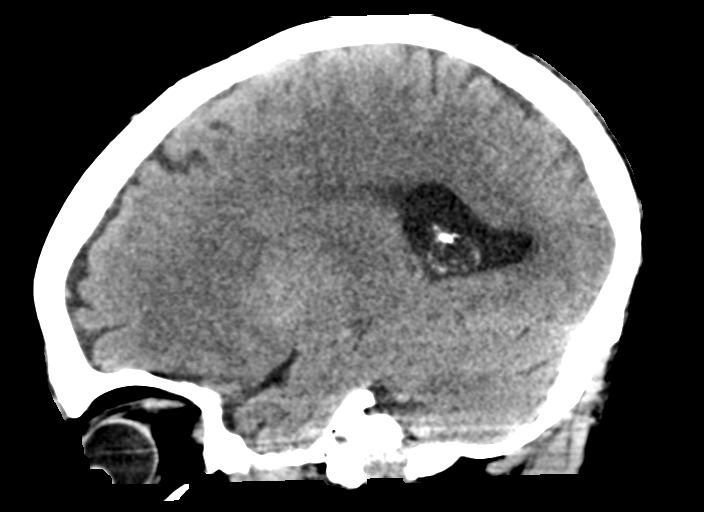

[15 of 47 positions shown; findings below may reference images not displayed]

FINDINGS: CT HEAD FINDINGS

Brain: There is no mass, hemorrhage or extra-axial collection. The
size and configuration of the ventricles and extra-axial CSF spaces
are normal. The brain parenchyma is normal, without evidence of
acute or chronic infarction.

Vascular: No hyperdense vessel or unexpected vascular calcification.

Skull: The visualized skull base, calvarium and extracranial soft
tissues are normal.

CT MAXILLOFACIAL FINDINGS

Osseous: Assessment of the mandible is severely degraded by motion.
But, there is a definite fracture of the left mandibular condyle.
There is suspected fracture of the proximal left mandibular ramus.

Orbits: The globes and optic nerves are intact. Normal extraocular
muscles and intraorbital fat.

Sinuses: No acute finding.

Soft tissues: Normal visualized extracranial soft tissues.
IMPRESSION: 1. No acute intracranial abnormality.
2. Assessment of the mandible is severely degraded by motion, but
there is a definite fracture of the left condylar process and
suspected fracture of the proximal left ramus. If more definite
characterization is needed for clinical decision making, repeat
maxillofacial CT may be helpful.

## 2021-02-18 ENCOUNTER — Other Ambulatory Visit: Payer: Self-pay | Admitting: Vascular Surgery

## 2021-03-14 ENCOUNTER — Other Ambulatory Visit: Payer: Self-pay | Admitting: Vascular Surgery

## 2021-03-24 ENCOUNTER — Other Ambulatory Visit: Payer: Self-pay | Admitting: Internal Medicine

## 2021-03-25 LAB — PSA: PSA: 0.47 ng/mL (ref <=4.00)

## 2021-03-25 LAB — LIPID PANEL
Cholesterol: 184 mg/dL (ref <200)
HDL: 45 mg/dL (ref >=40)
LDL Cholesterol (Calc): 116 mg/dL (calc) — ABNORMAL HIGH
Non-HDL Cholesterol (Calc): 139 mg/dL (calc) — ABNORMAL HIGH (ref <130)
Total CHOL/HDL Ratio: 4.1 (calc) (ref <5.0)
Triglycerides: 115 mg/dL (ref <150)

## 2021-03-25 LAB — CBC
HCT: 38.9 % (ref 38.5–50.0)
Hemoglobin: 13.2 g/dL (ref 13.2–17.1)
MCH: 31.3 pg (ref 27.0–33.0)
MCHC: 33.9 g/dL (ref 32.0–36.0)
MCV: 92.2 fL (ref 80.0–100.0)
MPV: 10.2 fL (ref 7.5–12.5)
Platelets: 192 10*3/uL (ref 140–400)
RBC: 4.22 10*6/uL (ref 4.20–5.80)
RDW: 11.6 % (ref 11.0–15.0)
WBC: 6.7 10*3/uL (ref 3.8–10.8)

## 2021-03-25 LAB — COMPLETE METABOLIC PANEL WITH GFR
AG Ratio: 1.6 (calc) (ref 1.0–2.5)
ALT: 14 U/L (ref 9–46)
AST: 18 U/L (ref 10–35)
Albumin: 4 g/dL (ref 3.6–5.1)
Alkaline phosphatase (APISO): 63 U/L (ref 35–144)
BUN: 17 mg/dL (ref 7–25)
CO2: 23 mmol/L (ref 20–32)
Calcium: 9.4 mg/dL (ref 8.6–10.3)
Chloride: 104 mmol/L (ref 98–110)
Creat: 1.24 mg/dL (ref 0.70–1.25)
GFR, Est African American: 69 mL/min/{1.73_m2} (ref >=60)
GFR, Est Non African American: 60 mL/min/{1.73_m2} (ref >=60)
Globulin: 2.5 g/dL (calc) (ref 1.9–3.7)
Glucose, Bld: 152 mg/dL — ABNORMAL HIGH (ref 65–99)
Potassium: 4.2 mmol/L (ref 3.5–5.3)
Sodium: 137 mmol/L (ref 135–146)
Total Bilirubin: 0.4 mg/dL (ref 0.2–1.2)
Total Protein: 6.5 g/dL (ref 6.1–8.1)

## 2021-03-25 LAB — TSH: TSH: 1.77 mIU/L (ref 0.40–4.50)

## 2021-03-25 LAB — VITAMIN D 25 HYDROXY (VIT D DEFICIENCY, FRACTURES): Vit D, 25-Hydroxy: 35 ng/mL (ref 30–100)

## 2021-09-15 ENCOUNTER — Ambulatory Visit: Payer: Medicare Other | Attending: Internal Medicine

## 2021-09-15 ENCOUNTER — Other Ambulatory Visit (HOSPITAL_BASED_OUTPATIENT_CLINIC_OR_DEPARTMENT_OTHER): Payer: Self-pay

## 2021-09-15 DIAGNOSIS — Z23 Encounter for immunization: Secondary | ICD-10-CM

## 2021-09-15 MED ORDER — INFLUENZA VAC A&B SA ADJ QUAD 0.5 ML IM PRSY
PREFILLED_SYRINGE | INTRAMUSCULAR | 0 refills | Status: DC
  Filled 2021-09-15: qty 0.5, 1d supply, fill #0

## 2021-09-15 NOTE — Progress Notes (Signed)
   Covid-19 Vaccination Clinic  Name:  Sean Lowe    MRN: [id number] DOB: 12/13/1955  09/15/2021  [name] was observed post Covid-19 immunization for 15 minutes without incident. He was provided with Vaccine Information Sheet and instruction to access the V-Safe system.   [name] was instructed to call [phone] with any severe reactions post vaccine: Difficulty breathing  Swelling of face and throat  A fast heartbeat  A bad rash all over body  Dizziness and weakness

## 2021-10-09 ENCOUNTER — Emergency Department (HOSPITAL_COMMUNITY): Payer: Medicare Other

## 2021-10-09 ENCOUNTER — Encounter (HOSPITAL_COMMUNITY): Payer: Self-pay

## 2021-10-09 ENCOUNTER — Other Ambulatory Visit: Payer: Self-pay

## 2021-10-09 ENCOUNTER — Emergency Department (HOSPITAL_COMMUNITY)
Admission: EM | Admit: 2021-10-09 | Discharge: 2021-10-09 | Disposition: A | Discharge status: Home / Self Care | Payer: Medicare Other | Attending: Emergency Medicine | Admitting: Emergency Medicine

## 2021-10-09 DIAGNOSIS — W010XXA Fall on same level from slipping, tripping and stumbling without subsequent striking against object, initial encounter: Secondary | ICD-10-CM | POA: Insufficient documentation

## 2021-10-09 DIAGNOSIS — W19XXXA Unspecified fall, initial encounter: Secondary | ICD-10-CM

## 2021-10-09 DIAGNOSIS — Z79899 Other long term (current) drug therapy: Secondary | ICD-10-CM | POA: Diagnosis not present

## 2021-10-09 DIAGNOSIS — F17210 Nicotine dependence, cigarettes, uncomplicated: Secondary | ICD-10-CM | POA: Diagnosis not present

## 2021-10-09 DIAGNOSIS — S40012A Contusion of left shoulder, initial encounter: Secondary | ICD-10-CM | POA: Diagnosis not present

## 2021-10-09 DIAGNOSIS — Z7982 Long term (current) use of aspirin: Secondary | ICD-10-CM | POA: Diagnosis not present

## 2021-10-09 DIAGNOSIS — S4992XA Unspecified injury of left shoulder and upper arm, initial encounter: Secondary | ICD-10-CM | POA: Diagnosis present

## 2021-10-09 NOTE — ED Triage Notes (Signed)
Pt BIB EMS. Pt coming from group home, pt stated he tripped and fell last night at the bus stop, c/o left arm pain. Denies any neck or back pain. No blood thinners. No LOC. Pt was able to walk with steady gait to ED stretcher. Per EMS there were visible bed bugs seen on the patients jacket and EMS killed several before arriving. Pt confirmed he knew about bed bugs on his belongings.   BP-110/70 HR-82 RR-22 O2-97% RA

## 2021-10-09 NOTE — ED Notes (Signed)
Group home name is: [facility]. [phone]. Talked with staff at [facility] they will not be picking this pt up but pt would need bus pass to get back to group home.

## 2021-10-09 NOTE — ED Notes (Signed)
Pt received cab ticket to go back to group home.

## 2021-10-09 NOTE — ED Provider Notes (Signed)
[facility]-EMERGENCY DEPT Provider Note   CSN: [id number] Arrival date & time: 10/09/21  8338     History Chief Complaint  Patient presents with   Marletta Lor    Sean Lowe is a 65 y.o. male.  He has a history of schizophrenia lives in a group home.  He said he fell last night when he tripped and landed on his left shoulder.  Complaining of left shoulder pain.  Left hand dominant.  Denies any loss of consciousness head neck or back pain.  No numbness or weakness.  Staff also concerned that they saw bedbugs on his clothing.  He said there were bedbugs at his facility.  No medical complaints.  The history is provided by the patient.  Fall This is a new problem. The current episode started yesterday. The problem has not changed since onset.Pertinent negatives include no chest pain, no abdominal pain, no headaches and no shortness of breath. Associated symptoms comments: Left shoulder pain. The symptoms are aggravated by bending. Nothing relieves the symptoms. He has tried rest for the symptoms. The treatment provided no relief.      Past Medical History:  Diagnosis Date   AAA (abdominal aortic aneurysm)    Anxiety    Psychiatric diagnosis    Schizophrenia [facility])     Patient Active Problem List   Diagnosis Date Noted   AAA (abdominal aortic aneurysm) without rupture 12/25/2015   Paranoid schizophrenia (HCC) 04/19/2014    Past Surgical History:  Procedure Laterality Date   FINGER SURGERY Left    repair from injury       Family History  Problem Relation Age of Onset   Stroke Father     Social History   Tobacco Use   Smoking status: Every Day    Packs/day: 1.50    Years: 42.00    Pack years: 63.00    Types: Cigarettes   Smokeless tobacco: Never  Vaping Use   Vaping Use: Never used  Substance Use Topics   Alcohol use: Yes    Alcohol/week: 0.0 standard drinks    Comment: occasional   Drug use: No    Home Medications Prior to Admission  medications   Medication Sig Start Date End Date Taking? Authorizing Provider  acetaminophen (TYLENOL) 325 MG tablet Take 650 mg by mouth every 4 (four) hours as needed. Reported on 12/25/2015    [provider]  ASPIRIN LOW DOSE 81 MG EC tablet TAKE (1) TABLET BY MOUTH ONCE DAILY. 03/14/21   Maeola Harman, MD  bacitracin ointment Apply 1 application topically 2 (two) times daily. 11/13/20   Ward, Layla Maw, DO  docusate sodium (COLACE) 100 MG capsule Take 1 capsule (100 mg total) by mouth every 12 (twelve) hours. Take while taking narcotic pain medication 11/13/20   Ward, Layla Maw, DO  fluPHENAZine (PROLIXIN) 2.5 MG tablet Take 7.5 mg by mouth daily.     [provider]  ibuprofen (ADVIL) 600 MG tablet Take 1 tablet (600 mg total) by mouth every 6 (six) hours as needed. 11/13/20   Ward, Layla Maw, DO  influenza vaccine adjuvanted (FLUAD) 0.5 ML injection Inject into the muscle. 09/15/21   Judyann Munson, MD  ondansetron (ZOFRAN ODT) 4 MG disintegrating tablet Take 1 tablet (4 mg total) by mouth every 8 (eight) hours as needed for nausea or vomiting. 11/13/20   Ward, Layla Maw, DO  oxyCODONE-acetaminophen (PERCOCET/ROXICET) 5-325 MG tablet Take 1 tablet by mouth every 4 (four) hours as needed. 11/13/20  Ward, Kristen N, DO  rosuvastatin (CRESTOR) 10 MG tablet TAKE (1) TABLET BY MOUTH ONCE DAILY. 02/24/21   Maeola Harman, MD    Allergies    Patient has no known allergies.  Review of Systems   Review of Systems  Constitutional:  Negative for fever.  HENT:  Negative for sore throat.   Eyes:  Negative for visual disturbance.  Respiratory:  Negative for shortness of breath.   Cardiovascular:  Negative for chest pain.  Gastrointestinal:  Negative for abdominal pain.  Genitourinary:  Negative for dysuria.  Musculoskeletal:  Negative for back pain and neck pain.  Skin:  Negative for rash.  Neurological:  Negative for headaches.   Physical Exam Updated  Vital Signs BP (!) 108/59   Pulse 69   Temp 98.5 F (36.9 C) (Oral)   Resp 20   Ht 5\' 10"  (1.778 m)   Wt 68 kg   SpO2 97%   BMI 21.51 kg/m   Physical Exam Vitals and nursing note reviewed.  Constitutional:      Appearance: Normal appearance. He is well-developed.  HENT:     Head: Normocephalic and atraumatic.  Eyes:     Conjunctiva/sclera: Conjunctivae normal.  Cardiovascular:     Rate and Rhythm: Normal rate and regular rhythm.     Heart sounds: No murmur heard. Pulmonary:     Effort: Pulmonary effort is normal. No respiratory distress.     Breath sounds: Normal breath sounds.  Abdominal:     Palpations: Abdomen is soft.     Tenderness: There is no abdominal tenderness. There is no guarding.  Musculoskeletal:        General: Tenderness present. No deformity. Normal range of motion.     Cervical back: Neck supple.     Comments: Has diffuse tenderness around his left shoulder.  Clavicle nontender.  Elbow and wrist nontender.  Normal sensation and motor distally.  Radial pulse 2+.  Skin:    General: Skin is warm and dry.  Neurological:     General: No focal deficit present.     Mental Status: He is alert.    ED Results / Procedures / Treatments   Labs (all labs ordered are listed, but only abnormal results are displayed) Labs Reviewed - No data to display  EKG None  Radiology DG Shoulder Left  Result Date: 10/09/2021 CLINICAL DATA:  Fall with left shoulder pain. EXAM: LEFT SHOULDER - 2+ VIEW COMPARISON:  None. FINDINGS: There is no evidence of fracture or dislocation. There is no evidence of arthropathy or other focal bone abnormality. Soft tissues are unremarkable. IMPRESSION: Negative. Electronically Signed   By: 13/09/2021 M.D.   On: 10/09/2021 10:41    Procedures Procedures   Medications Ordered in ED Medications - No data to display  ED Course  I have reviewed the triage vital signs and the nursing notes.  Pertinent labs & imaging results that  were available during my care of the patient were reviewed by me and considered in my medical decision making (see chart for details).  Clinical Course as of 10/09/21 1933  Fri Oct 09, 2021  1014 Precautions were taken to isolate the room.  Patient changed out of his clothing and into clean scrubs.  Clothing will be bagged up.  He said there are bedbugs at his facility and the staff have somebody on contract to come fumigate.  He denies any complaints regarding the bedbugs. [MB]  1044 X-ray of left shoulder does not show any  fracture or dislocation. [MB]  1046 Nurse was able to call patient's group home but they are unable to come pick him up.  They felt he was okay to take the bus back there.  She is contacting social work to see if a bus pass is available. [MB]    Clinical Course User Index [MB] Terrilee Files, MD   MDM Rules/Calculators/A&P                            Final Clinical Impression(s) / ED Diagnoses Final diagnoses:  Fall, initial encounter  Contusion of left shoulder, initial encounter    Rx / DC Orders ED Discharge Orders     None        Terrilee Files, MD 10/09/21 [phone]

## 2021-10-09 NOTE — Progress Notes (Addendum)
TOC CSW provided pt with bus pass for ride upon d/c.   Adden 12:40pm Pt is not allowed to ride bus due to him falling this morning at bus stop, CSW attempted to contact Transportation services however was on hold for 20 mins , RN was on hold for 40 mins prior. CSW provided pt with Taxi voucher for ride home upon d/c.  Valentina Shaggy.Chelsia Serres, MSW, LCSWA [facility]  Transitions of Care Clinical Social Worker I Direct Dial: [phone]  Fax: [phone] [email or web address]

## 2021-10-09 NOTE — ED Notes (Signed)
EMS was unsure of the name of the group home the pt came from but they did know the address.   [address] [place] [address]

## 2021-10-09 NOTE — Discharge Instructions (Addendum)
You were seen in the emergency department for evaluation of left shoulder pain after a fall.  You had x-rays of your left shoulder that do not show any fracture or dislocation.  You can use ice to the area and take Tylenol or ibuprofen as needed for pain.  Return to the emergency department if any worsening or concerning symptoms.

## 2021-10-13 ENCOUNTER — Other Ambulatory Visit (HOSPITAL_BASED_OUTPATIENT_CLINIC_OR_DEPARTMENT_OTHER): Payer: Self-pay

## 2021-10-13 MED ORDER — PFIZER COVID-19 VAC BIVALENT 30 MCG/0.3ML IM SUSP
INTRAMUSCULAR | 0 refills | Status: DC
  Filled 2021-10-13: qty 0.3, 1d supply, fill #0

## 2021-11-24 IMAGING — DX DG SHOULDER 2+V*L*
2 series · 2 of 2 positions shown · non-contrast
Comparison: None.

CLINICAL DATA: Fall with left shoulder pain.

EXAM:
LEFT SHOULDER - 2+ VIEW

[shoulder ap]
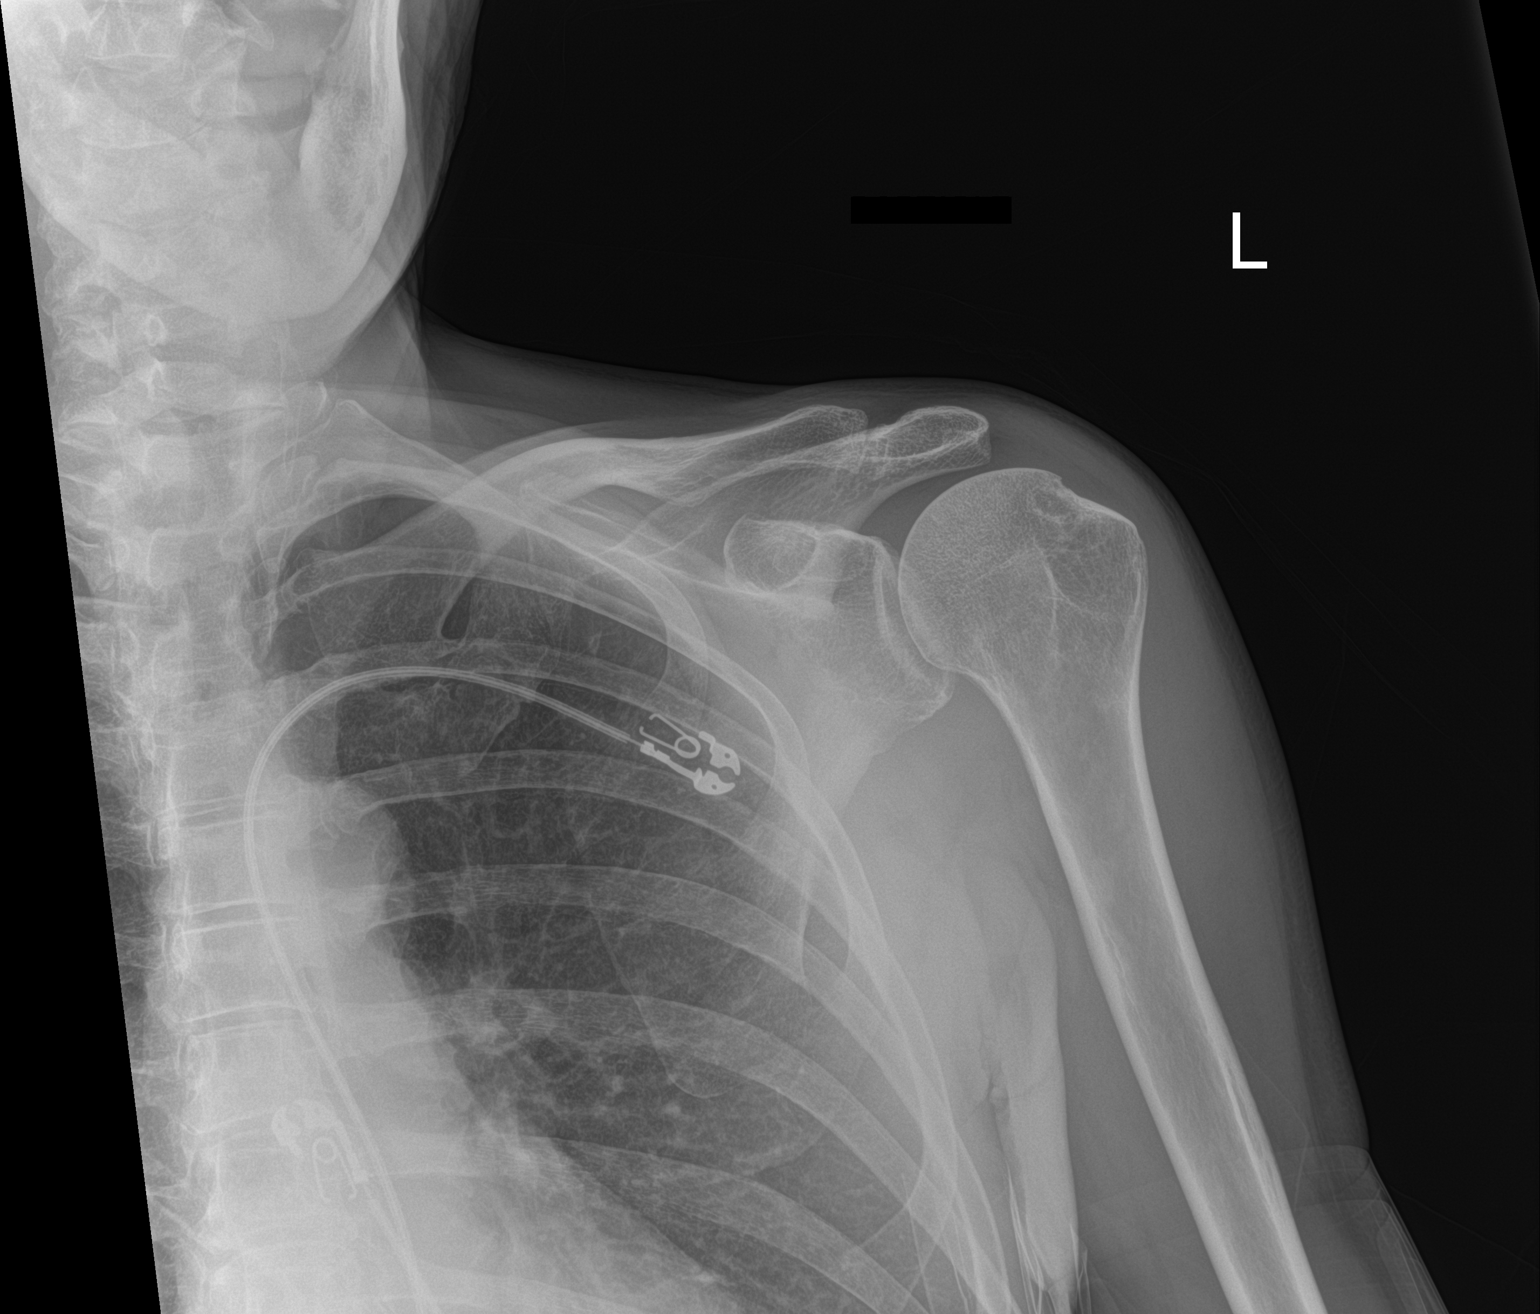

[shoulder obl]
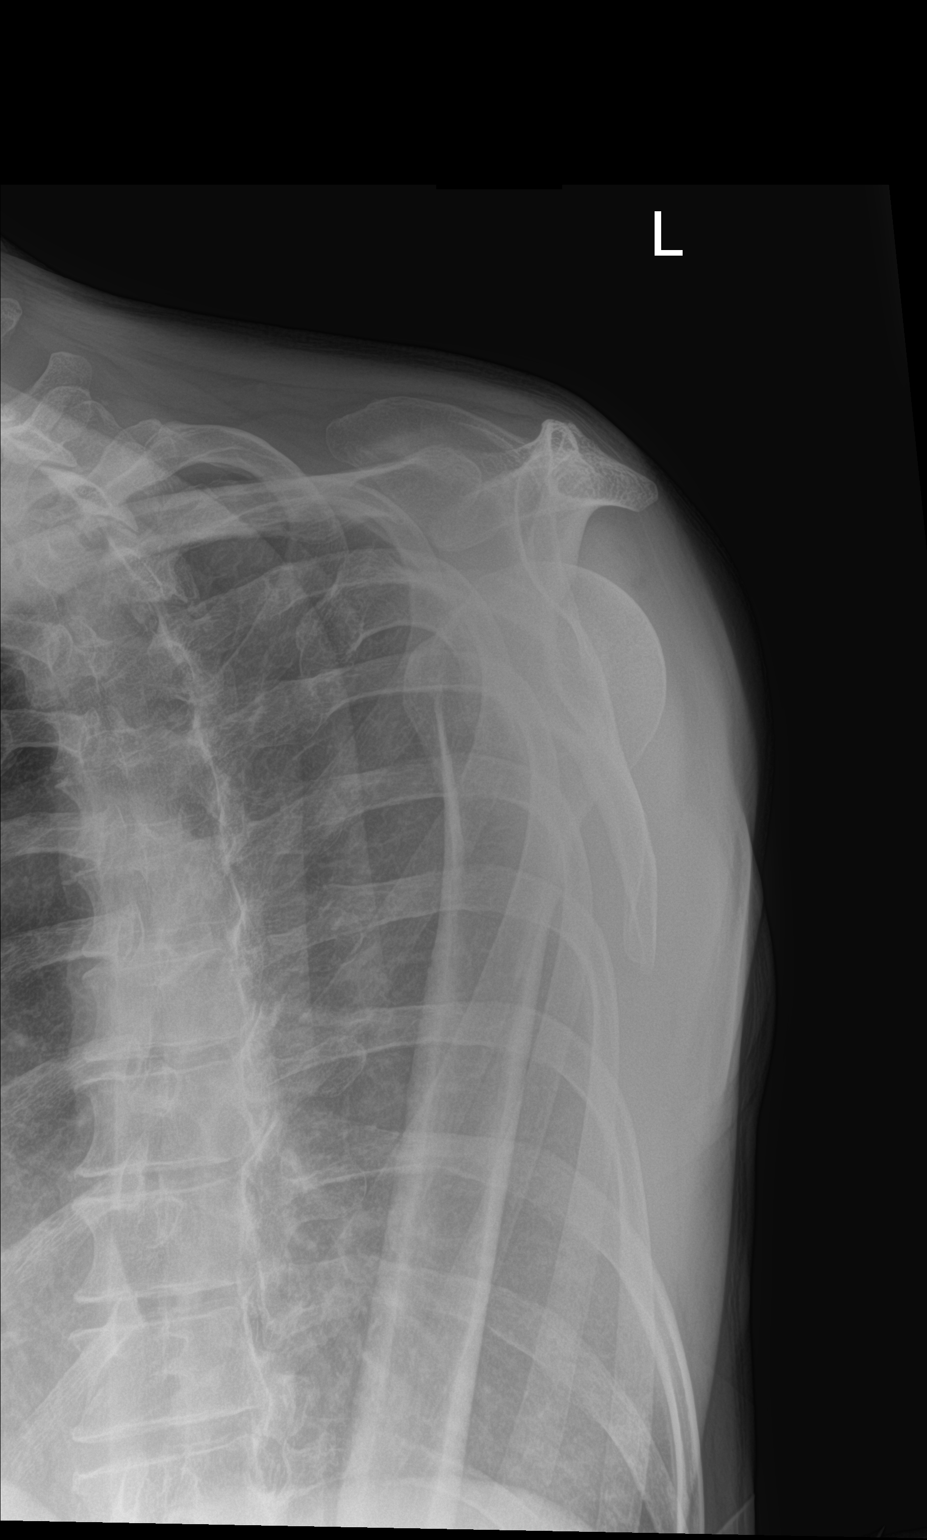

[2 of 2 positions shown; findings below may reference images not displayed]

FINDINGS: There is no evidence of fracture or dislocation. There is no
evidence of arthropathy or other focal bone abnormality. Soft
tissues are unremarkable.
IMPRESSION: Negative.

## 2022-07-24 LAB — GLUCOSE, POCT (MANUAL RESULT ENTRY): POC Glucose: 99 mg/dl (ref 70–99)

## 2022-12-15 ENCOUNTER — Emergency Department (HOSPITAL_COMMUNITY): Payer: 59

## 2022-12-15 ENCOUNTER — Encounter (HOSPITAL_COMMUNITY): Payer: Self-pay

## 2022-12-15 ENCOUNTER — Emergency Department (HOSPITAL_COMMUNITY)
Admission: EM | Admit: 2022-12-15 | Discharge: 2022-12-16 | Discharge status: Against Medical Advice | Payer: 59 | Attending: Emergency Medicine | Admitting: Emergency Medicine

## 2022-12-15 DIAGNOSIS — S0101XA Laceration without foreign body of scalp, initial encounter: Secondary | ICD-10-CM | POA: Diagnosis not present

## 2022-12-15 DIAGNOSIS — Z5321 Procedure and treatment not carried out due to patient leaving prior to being seen by health care provider: Secondary | ICD-10-CM | POA: Insufficient documentation

## 2022-12-15 DIAGNOSIS — W01190A Fall on same level from slipping, tripping and stumbling with subsequent striking against furniture, initial encounter: Secondary | ICD-10-CM | POA: Diagnosis not present

## 2022-12-15 DIAGNOSIS — R55 Syncope and collapse: Secondary | ICD-10-CM | POA: Diagnosis present

## 2022-12-15 DIAGNOSIS — S01312A Laceration without foreign body of left ear, initial encounter: Secondary | ICD-10-CM | POA: Insufficient documentation

## 2022-12-15 LAB — CBC WITH DIFFERENTIAL/PLATELET
Abs Immature Granulocytes: 0.02 10*3/uL (ref 0.00–0.07)
Basophils Absolute: 0.1 10*3/uL (ref 0.0–0.1)
Basophils Relative: 1 %
Eosinophils Absolute: 0.2 10*3/uL (ref 0.0–0.5)
Eosinophils Relative: 2 %
HCT: 39.4 % (ref 39.0–52.0)
Hemoglobin: 13 g/dL (ref 13.0–17.0)
Immature Granulocytes: 0 %
Lymphocytes Relative: 24 %
Lymphs Abs: 1.6 10*3/uL (ref 0.7–4.0)
MCH: 31.8 pg (ref 26.0–34.0)
MCHC: 33 g/dL (ref 30.0–36.0)
MCV: 96.3 fL (ref 80.0–100.0)
Monocytes Absolute: 0.5 10*3/uL (ref 0.1–1.0)
Monocytes Relative: 8 %
Neutro Abs: 4.4 10*3/uL (ref 1.7–7.7)
Neutrophils Relative %: 65 %
Platelets: 170 10*3/uL (ref 150–400)
RBC: 4.09 MIL/uL — ABNORMAL LOW (ref 4.22–5.81)
RDW: 12.4 % (ref 11.5–15.5)
WBC: 6.7 10*3/uL (ref 4.0–10.5)
nRBC: 0 % (ref 0.0–0.2)

## 2022-12-15 LAB — BASIC METABOLIC PANEL
Anion gap: 6 (ref 5–15)
BUN: 14 mg/dL (ref 8–23)
CO2: 27 mmol/L (ref 22–32)
Calcium: 9 mg/dL (ref 8.9–10.3)
Chloride: 104 mmol/L (ref 98–111)
Creatinine, Ser: 1.22 mg/dL (ref 0.61–1.24)
GFR, Estimated: >60 mL/min (ref >60)
Glucose, Bld: 89 mg/dL (ref 70–99)
Potassium: 4.2 mmol/L (ref 3.5–5.1)
Sodium: 137 mmol/L (ref 135–145)

## 2022-12-15 LAB — CBG MONITORING, ED: Glucose-Capillary: 103 mg/dL — ABNORMAL HIGH (ref 70–99)

## 2022-12-15 MED ORDER — SODIUM CHLORIDE 0.9 % IV SOLN: INTRAVENOUS | Status: DC

## 2022-12-15 MED ORDER — SODIUM CHLORIDE 0.9 % IV BOLUS: 500.0000 mL | Freq: Once | INTRAVENOUS | Status: DC

## 2022-12-15 MED ORDER — BACITRACIN ZINC 500 UNIT/GM EX OINT: TOPICAL_OINTMENT | Freq: Once | CUTANEOUS | Status: DC

## 2022-12-15 NOTE — ED Notes (Signed)
I took patient IV out .  Patient stated "he wanted IV out because he was leaving.  I made RN aware.

## 2022-12-15 NOTE — ED Provider Triage Note (Signed)
Emergency Medicine Provider Triage Evaluation Note  Sean Lowe , a 67 y.o. male  was evaluated in triage.  Pt complains of syncopal event.  Dates he was standing making coffee when he had an episode of feeling weak/numb and he fell to the ground.  Patient states the prodrome lasted maybe a couple of seconds.  He then was on the ground for couple seconds.  He did strike his head on a cabinet.  He denies severe headache or vomiting.  No numbness or weakness at this time.  No chest pain.  No shortness of breath.  No dizziness.  Review of Systems  Positive: Syncope Negative: Headache Physical Exam  BP (!) 141/81 (BP Location: Left Arm)   Pulse 71   Temp 97.6 F (36.4 C) (Oral)   Resp 14   SpO2 100%  Gen:   Awake, no distress   Resp:  Normal effort  MSK:   Moves extremities without difficulty  Other:  Superficial avulsion type injury to the scalp, circular area approximately 1.5 cm epidermis avulsed  Medical Decision Making  Medically screening exam initiated at 11:23 AM.  Appropriate orders placed.  Willaim Mode was informed that the remainder of the evaluation will be completed by another provider, this initial triage assessment does not replace that evaluation, and the importance of remaining in the ED until their evaluation is complete.     Dorie Rank, MD 12/15/22 1125

## 2022-12-15 NOTE — ED Triage Notes (Signed)
Pt arrived via EMS, from group home, syncopal event. Has had episodes of dizzy spells for several months. No thinners. Skin tear to head and left ear, bleeding controlled.   18G L AC 500cc NS given

## 2023-05-25 ENCOUNTER — Other Ambulatory Visit: Payer: Self-pay | Admitting: Internal Medicine

## 2023-05-26 LAB — COMPLETE METABOLIC PANEL WITH GFR
AG Ratio: 1.4 (calc) (ref 1.0–2.5)
ALT: 17 U/L (ref 9–46)
AST: 18 U/L (ref 10–35)
Albumin: 3.9 g/dL (ref 3.6–5.1)
Alkaline phosphatase (APISO): 67 U/L (ref 35–144)
BUN: 17 mg/dL (ref 7–25)
CO2: 22 mmol/L (ref 20–32)
Calcium: 9.3 mg/dL (ref 8.6–10.3)
Chloride: 107 mmol/L (ref 98–110)
Creat: 1.2 mg/dL (ref 0.70–1.35)
Globulin: 2.7 g/dL (calc) (ref 1.9–3.7)
Glucose, Bld: 120 mg/dL — ABNORMAL HIGH (ref 65–99)
Potassium: 4.3 mmol/L (ref 3.5–5.3)
Sodium: 139 mmol/L (ref 135–146)
Total Bilirubin: 0.4 mg/dL (ref 0.2–1.2)
Total Protein: 6.6 g/dL (ref 6.1–8.1)
eGFR: 65 mL/min/{1.73_m2} (ref >=60)

## 2023-05-26 LAB — LIPID PANEL
Cholesterol: 109 mg/dL (ref <200)
HDL: 42 mg/dL (ref >=40)
LDL Cholesterol (Calc): 51 mg/dL (calc)
Non-HDL Cholesterol (Calc): 67 mg/dL (calc) (ref <130)
Total CHOL/HDL Ratio: 2.6 (calc) (ref <5.0)
Triglycerides: 76 mg/dL (ref <150)

## 2023-05-26 LAB — CBC
HCT: 37.1 % — ABNORMAL LOW (ref 38.5–50.0)
Hemoglobin: 12.7 g/dL — ABNORMAL LOW (ref 13.2–17.1)
MCH: 31.4 pg (ref 27.0–33.0)
MCHC: 34.2 g/dL (ref 32.0–36.0)
MCV: 91.8 fL (ref 80.0–100.0)
MPV: 10.7 fL (ref 7.5–12.5)
Platelets: 186 10*3/uL (ref 140–400)
RBC: 4.04 10*6/uL — ABNORMAL LOW (ref 4.20–5.80)
RDW: 11.7 % (ref 11.0–15.0)
WBC: 7.1 10*3/uL (ref 3.8–10.8)

## 2023-05-26 LAB — PSA: PSA: 0.5 ng/mL (ref <=4.00)

## 2023-05-26 LAB — TSH: TSH: 1.74 mIU/L (ref 0.40–4.50)

## 2023-05-26 LAB — VITAMIN D 25 HYDROXY (VIT D DEFICIENCY, FRACTURES): Vit D, 25-Hydroxy: 44 ng/mL (ref 30–100)

## 2023-05-27 LAB — SARS-COV-2 RNA,(COVID-19) QUALITATIVE NAAT: SARS CoV2 RNA: NOT DETECTED

## 2023-07-14 ENCOUNTER — Other Ambulatory Visit: Payer: Self-pay

## 2023-07-14 ENCOUNTER — Encounter (HOSPITAL_COMMUNITY): Payer: Self-pay

## 2023-07-14 ENCOUNTER — Emergency Department (HOSPITAL_COMMUNITY)
Admission: EM | Admit: 2023-07-14 | Discharge: 2023-07-15 | Discharge status: Against Medical Advice | Payer: 59 | Source: Home / Self Care

## 2023-07-14 DIAGNOSIS — R42 Dizziness and giddiness: Secondary | ICD-10-CM | POA: Insufficient documentation

## 2023-07-14 DIAGNOSIS — Z5321 Procedure and treatment not carried out due to patient leaving prior to being seen by health care provider: Secondary | ICD-10-CM | POA: Insufficient documentation

## 2023-07-14 NOTE — ED Triage Notes (Signed)
Arrives [facility] from [facility] with dizziness after standing.   Group home wanted him sent to ED for evaluation.

## 2023-07-15 LAB — CBC
HCT: 37.5 % — ABNORMAL LOW (ref 39.0–52.0)
Hemoglobin: 12.6 g/dL — ABNORMAL LOW (ref 13.0–17.0)
MCH: 32.1 pg (ref 26.0–34.0)
MCHC: 33.6 g/dL (ref 30.0–36.0)
MCV: 95.4 fL (ref 80.0–100.0)
Platelets: 166 10*3/uL (ref 150–400)
RBC: 3.93 MIL/uL — ABNORMAL LOW (ref 4.22–5.81)
RDW: 12.5 % (ref 11.5–15.5)
WBC: 7.7 10*3/uL (ref 4.0–10.5)
nRBC: 0 % (ref 0.0–0.2)

## 2023-07-15 LAB — BASIC METABOLIC PANEL
Anion gap: 7 (ref 5–15)
BUN: 15 mg/dL (ref 8–23)
CO2: 26 mmol/L (ref 22–32)
Calcium: 9.2 mg/dL (ref 8.9–10.3)
Chloride: 101 mmol/L (ref 98–111)
Creatinine, Ser: 1.24 mg/dL (ref 0.61–1.24)
GFR, Estimated: >60 mL/min (ref >60)
Glucose, Bld: 96 mg/dL (ref 70–99)
Potassium: 3.9 mmol/L (ref 3.5–5.1)
Sodium: 134 mmol/L — ABNORMAL LOW (ref 135–145)

## 2023-07-15 NOTE — ED Notes (Signed)
Pt VERY ambulatory in triage. Does not like staying in his room. No dizziness.

## 2023-07-15 NOTE — ED Notes (Signed)
Pt. Walked out of EMS bay doors. Followed pt shortly after saw him up the street walking towards friendly. Waited for another staff member due to safety reasons. Attempted to look for patient with multiple staff members and security. Unable to find pt.

## 2023-07-15 NOTE — ED Notes (Signed)
Pt left ED through EMS doors. Dr Nicanor Alcon notified. Pt lives at a group home in [place]. No guardian on file.

## 2023-07-15 NOTE — ED Notes (Signed)
Called Melissa at [facility], confirmed pt does not have a legal guardian and they area aware the pt left the ED on his own.

## 2023-12-03 ENCOUNTER — Emergency Department (HOSPITAL_COMMUNITY): Payer: 59

## 2023-12-03 ENCOUNTER — Emergency Department (HOSPITAL_COMMUNITY)
Admission: EM | Admit: 2023-12-03 | Discharge: 2023-12-04 | Disposition: A | Discharge status: Home / Self Care | Payer: 59 | Attending: Emergency Medicine | Admitting: Emergency Medicine

## 2023-12-03 ENCOUNTER — Other Ambulatory Visit: Payer: Self-pay

## 2023-12-03 DIAGNOSIS — M25551 Pain in right hip: Secondary | ICD-10-CM | POA: Diagnosis present

## 2023-12-03 DIAGNOSIS — W19XXXA Unspecified fall, initial encounter: Secondary | ICD-10-CM | POA: Diagnosis not present

## 2023-12-03 MED ORDER — ACETAMINOPHEN 500 MG PO TABS
1000.0000 mg | ORAL_TABLET | Freq: Once | ORAL | Status: AC
  Administered 2023-12-03: 1000 mg via ORAL
  Filled 2023-12-03: qty 2

## 2023-12-03 MED ORDER — OXYCODONE HCL 5 MG PO TABS
5.0000 mg | ORAL_TABLET | Freq: Once | ORAL | Status: AC
Start: 2023-12-03 — End: 2023-12-03
  Administered 2023-12-03: 5 mg via ORAL
  Filled 2023-12-03: qty 1

## 2023-12-03 NOTE — ED Triage Notes (Signed)
 Pt BIB [facility] from [facility] after a mechanical fall approx 45 mins prior to arrival. Denied LOC. Pt landed on concrete and complains of right hip pain only.

## 2023-12-03 NOTE — ED Provider Notes (Signed)
 [place] [facility] Provider Note   CSN: [id number] Arrival date & time: 12/03/23  2013     History Chief Complaint  Patient presents with   Fall    HPI Sean Lowe is a 68 y.o. male presenting for chief complaint probable fall.  He states he lost his balance fell onto his right side.  Pain in his right hip.  Patient's recorded medical, surgical, social, medication list and allergies were reviewed in the Snapshot window as part of the initial history.   Review of Systems   Review of Systems  Constitutional:  Negative for chills and fever.  HENT:  Negative for ear pain and sore throat.   Eyes:  Negative for pain and visual disturbance.  Respiratory:  Negative for cough and shortness of breath.   Cardiovascular:  Negative for chest pain and palpitations.  Gastrointestinal:  Negative for abdominal pain and vomiting.  Genitourinary:  Negative for dysuria and hematuria.  Musculoskeletal:  Negative for arthralgias and back pain.  Skin:  Negative for color change and rash.  Neurological:  Negative for seizures and syncope.  All other systems reviewed and are negative.   Physical Exam Updated Vital Signs BP 127/71   Pulse (!) 102   Temp 99.5 F (37.5 C) (Oral)   Resp 18   SpO2 96%  Physical Exam Vitals and nursing note reviewed.  Constitutional:      General: He is not in acute distress.    Appearance: He is well-developed.  HENT:     Head: Normocephalic and atraumatic.  Eyes:     Conjunctiva/sclera: Conjunctivae normal.  Cardiovascular:     Rate and Rhythm: Normal rate and regular rhythm.  Pulmonary:     Effort: Pulmonary effort is normal. No respiratory distress.  Abdominal:     General: Abdomen is flat. There is no distension.  Musculoskeletal:        General: No swelling or deformity.  Skin:    General: Skin is warm and dry.     Capillary Refill: Capillary refill takes less than 2 seconds.  Neurological:     Mental  Status: He is alert and oriented to person, place, and time. Mental status is at baseline.      ED Course/ Medical Decision Making/ A&P    Procedures Procedures   Medications Ordered in ED Medications  oxyCODONE  (Oxy IR/ROXICODONE ) immediate release tablet 5 mg (5 mg Oral Given 12/03/23 2203)  acetaminophen  (TYLENOL ) tablet 1,000 mg (1,000 mg Oral Given 12/03/23 2203)    Medical Decision Making:   Patient ground-level fall.  No visible injuries.  X-ray of injured right hip without focal pathology. Overall no acute distress. Disposition:  I have considered need for hospitalization, however, considering all of the above, I believe this patient is stable for discharge at this time.  Patient/family educated about specific return precautions for given chief complaint and symptoms.  Patient/family educated about follow-up with PCP.     Patient/family expressed understanding of return precautions and need for follow-up. Patient spoken to regarding all imaging and laboratory results and appropriate follow up for these results. All education provided in verbal form with additional information in written form. Time was allowed for answering of patient questions. Patient discharged.    Emergency Department Medication Summary:   Medications  oxyCODONE  (Oxy IR/ROXICODONE ) immediate release tablet 5 mg (5 mg Oral Given 12/03/23 2203)  acetaminophen  (TYLENOL ) tablet 1,000 mg (1,000 mg Oral Given 12/03/23 2203)  Clinical Impression:  1. Fall, initial encounter      Discharge   Final Clinical Impression(s) / ED Diagnoses Final diagnoses:  Fall, initial encounter    Rx / DC Orders ED Discharge Orders     None         Jerral Meth, MD 12/03/23 2322

## 2023-12-22 ENCOUNTER — Other Ambulatory Visit: Payer: Self-pay

## 2023-12-22 DIAGNOSIS — I714 Abdominal aortic aneurysm, without rupture, unspecified: Secondary | ICD-10-CM

## 2024-01-04 NOTE — Progress Notes (Signed)
 Office Note     CC:  AAA - 4.9cm 2020 Requesting Provider:  Shelda Atlas, MD  HPI: Sean Lowe is a 68 y.o. ([birth year]) male presenting at the request of .Shelda Atlas, MD after being lost to follow-up with known AAA.  Sean Lowe was last seen in 2020 with an aneurysm size of 5.3 cm.  On exam today, Sean Lowe was doing well, accompanied by his caregiver.  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 This diagnosis was made after a breakdown in college.  He has been able to hold jobs, but now is unemployed.  Sean Lowe denies family history of AAA.  Denies claudication, ischemic rest pain, tissue loss. He spends majority of his time watching TV, he enjoys the [employer] channel. He stated he has no family for me to discuss his diagnosis with.  The pt is  on a statin for cholesterol management.  The pt is  on a daily aspirin .   Other AC:  - The pt is  on medication for hypertension.   The pt is not diabetic.  Tobacco hx:  -  Past Medical History:  Diagnosis Date   AAA (abdominal aortic aneurysm) (HCC)    Anxiety    Psychiatric diagnosis    Schizophrenia (HCC)     Past Surgical History:  Procedure Laterality Date   FINGER SURGERY Left    repair from injury    Social History   Socioeconomic History   Marital status: Single    Spouse name: Not on file   Number of children: Not on file   Years of education: Not on file   Highest education level: Not on file  Occupational History   Not on file  Tobacco Use   Smoking status: Every Day    Current packs/day: 1.50    Average packs/day: 1.5 packs/day for 42.0 years (63.0 ttl pk-yrs)    Types: Cigarettes   Smokeless tobacco: Never  Vaping Use   Vaping status: Never Used  Substance and Sexual Activity   Alcohol use: Yes    Alcohol/week: 0.0 standard drinks of alcohol    Comment: occasional   Drug use: No   Sexual activity: Never    Birth control/protection: Abstinence  Other Topics Concern   Not on  file  Social History Narrative   Not on file   Social Drivers of Health   Financial Resource Strain: Not on file  Food Insecurity: Not on file  Transportation Needs: Not on file  Physical Activity: Not on file  Stress: Not on file  Social Connections: Not on file  Intimate Partner Violence: Not on file   Family History  Problem Relation Age of Onset   Stroke Father     Current Outpatient Medications  Medication Sig Dispense Refill   acetaminophen  (TYLENOL ) 325 MG tablet Take 650 mg by mouth every 4 (four) hours as needed. Reported on 12/25/2015     ASPIRIN  LOW DOSE 81 MG EC tablet TAKE (1) TABLET BY MOUTH ONCE DAILY. 30 tablet 11   bacitracin  ointment Apply 1 application topically 2 (two) times daily. 120 g 0   COVID-19 mRNA bivalent vaccine, Pfizer, (PFIZER COVID-19 VAC BIVALENT) injection Inject into the muscle. 0.3 mL 0   docusate sodium  (COLACE) 100 MG capsule Take 1 capsule (100 mg total) by mouth every 12 (twelve) hours. Take while taking narcotic pain medication 60 capsule 0   fluPHENAZine  (PROLIXIN ) 2.5 MG tablet Take 7.5 mg by mouth daily.  ibuprofen  (ADVIL ) 600 MG tablet Take 1 tablet (600 mg total) by mouth every 6 (six) hours as needed. 30 tablet 0   influenza vaccine adjuvanted (FLUAD) 0.5 ML injection Inject into the muscle. 0.5 mL 0   ondansetron  (ZOFRAN  ODT) 4 MG disintegrating tablet Take 1 tablet (4 mg total) by mouth every 8 (eight) hours as needed for nausea or vomiting. 20 tablet 0   oxyCODONE -acetaminophen  (PERCOCET/ROXICET) 5-325 MG tablet Take 1 tablet by mouth every 4 (four) hours as needed. 15 tablet 0   rosuvastatin  (CRESTOR ) 10 MG tablet TAKE (1) TABLET BY MOUTH ONCE DAILY. 13 tablet 11   No current facility-administered medications for this visit.    No Known Allergies   REVIEW OF SYSTEMS:  [X]  denotes positive finding, [ ]  denotes negative finding Cardiac  Comments:  Chest pain or chest pressure:    Shortness of breath upon exertion:     Short of breath when lying flat:    Irregular heart rhythm:        Vascular    Pain in calf, thigh, or hip brought on by ambulation:    Pain in feet at night that wakes you up from your sleep:     Blood clot in your veins:    Leg swelling:         Pulmonary    Oxygen at home:    Productive cough:     Wheezing:         Neurologic    Sudden weakness in arms or legs:     Sudden numbness in arms or legs:     Sudden onset of difficulty speaking or slurred speech:    Temporary loss of vision in one eye:     Problems with dizziness:         Gastrointestinal    Blood in stool:     Vomited blood:         Genitourinary    Burning when urinating:     Blood in urine:        Psychiatric    Major depression:         Hematologic    Bleeding problems:    Problems with blood clotting too easily:        Skin    Rashes or ulcers:        Constitutional    Fever or chills:      PHYSICAL EXAMINATION:  There were no vitals filed for this visit.  General:  WDWN in NAD; vital signs documented above Gait: Not observed HENT: WNL, normocephalic Pulmonary: normal non-labored breathing , without wheezing Cardiac: regular HR Abdomen: soft, NT, no masses Skin: without rashes Vascular Exam/Pulses:  Right Left  Radial 2+ (normal) 2+ (normal)  Ulnar    Femoral    Popliteal    DP 2+ (normal) 2+ (normal)  PT     Extremities: without ischemic changes, without Gangrene , without cellulitis; without open wounds;  Musculoskeletal: no muscle wasting or atrophy  Neurologic: A&O X 3;  No focal weakness or paresthesias are detected Psychiatric:  The pt has Normal affect.   Non-Invasive Vascular Imaging:    Abdominal Aorta Findings:  +-----------+-------+----------+----------+--------+--------+--------+  Location  AP (cm)Trans (cm)PSV (cm/s)WaveformThrombusComments  +-----------+-------+----------+----------+--------+--------+--------+  Proximal  2.30   2.38                                           +-----------+-------+----------+----------+--------+--------+--------+  Mid       3.43   3.28      169                                 +-----------+-------+----------+----------+--------+--------+--------+  Distal    5.88   6.00      110               Present           +-----------+-------+----------+----------+--------+--------+--------+  RT CIA Prox1.0    0.9       148                                 +-----------+-------+----------+----------+--------+--------+--------+  LT CIA Prox0.7    0.8       108                                 +-----------+-------+----------+----------+--------+--------+--------+      ASSESSMENT/PLAN: Sean Lowe is a 68 y.o. male presenting with asymptomatic sick centimeter AAA.  This is grown significantly over the last 5 years.  At this size, SVS guidelines recommend elective repair.  I had a long discussion with Sean Lowe regarding the above.  I have ordered a CT angio abdomen pelvis in an effort to better define the aneurysm to assess his candidacy for endovascular versus open repair.  My plan is to see him after the CT angio has been completed, hopefully in 1 to 2 weeks.  We discussed the signs and symptoms of rupture.  I asked him to call 911 immediately should any of the symptoms occur.    Fonda FORBES Rim, MD [facility] [phone]

## 2024-01-05 ENCOUNTER — Other Ambulatory Visit: Payer: Self-pay

## 2024-01-05 ENCOUNTER — Encounter: Payer: Self-pay | Admitting: Vascular Surgery

## 2024-01-05 ENCOUNTER — Ambulatory Visit: Payer: 59 | Admitting: Vascular Surgery

## 2024-01-05 ENCOUNTER — Ambulatory Visit (HOSPITAL_COMMUNITY)
Admission: RE | Admit: 2024-01-05 | Discharge: 2024-01-05 | Disposition: A | Discharge status: Home / Self Care | Payer: 59 | Source: Ambulatory Visit | Attending: Vascular Surgery | Admitting: Vascular Surgery

## 2024-01-05 VITALS — BP 101/67 | HR 91 | Temp 98.1°F | Resp 20 | Ht 70.0 in | Wt 148.0 lb

## 2024-01-05 DIAGNOSIS — I714 Abdominal aortic aneurysm, without rupture, unspecified: Secondary | ICD-10-CM

## 2024-01-05 DIAGNOSIS — I713 Abdominal aortic aneurysm, ruptured, unspecified: Secondary | ICD-10-CM

## 2024-01-16 NOTE — Progress Notes (Deleted)
 Office Note     CC:  AAA - 4.9cm 2020 Requesting Provider:  Fleet Contras, MD  HPI: Sean Lowe is a 68 y.o. (Mar 04, 1956) male presenting at the request of .Fleet Contras, MD after being lost to follow-up with known AAA.  Sean Lowe was last seen in 2020 with an aneurysm size of 5.3 cm.  On exam today, Sean Lowe was doing well, accompanied by his caregiver.  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 This diagnosis was made after a breakdown in college.  He has been able to hold jobs, but now is unemployed.  Sean Lowe denies family history of AAA.  Denies claudication, ischemic rest pain, tissue loss. He spends majority of his time watching TV, he enjoys the [employer] channel. He stated he has no family for me to discuss his diagnosis with.  The pt is  on a statin for cholesterol management.  The pt is  on a daily aspirin.   Other AC:  - The pt is  on medication for hypertension.   The pt is not diabetic.  Tobacco hx:  -  Past Medical History:  Diagnosis Date   AAA (abdominal aortic aneurysm) (HCC)    Anxiety    Psychiatric diagnosis    Schizophrenia (HCC)     Past Surgical History:  Procedure Laterality Date   FINGER SURGERY Left    repair from injury    Social History   Socioeconomic History   Marital status: Single    Spouse name: Not on file   Number of children: Not on file   Years of education: Not on file   Highest education level: Not on file  Occupational History   Not on file  Tobacco Use   Smoking status: Every Day    Current packs/day: 1.50    Average packs/day: 1.5 packs/day for 42.0 years (63.0 ttl pk-yrs)    Types: Cigarettes   Smokeless tobacco: Never  Vaping Use   Vaping status: Never Used  Substance and Sexual Activity   Alcohol use: Yes    Alcohol/week: 0.0 standard drinks of alcohol    Comment: occasional   Drug use: No   Sexual activity: Never    Birth control/protection: Abstinence  Other Topics Concern   Not on  file  Social History Narrative   Not on file   Social Drivers of Health   Financial Resource Strain: Not on file  Food Insecurity: Not on file  Transportation Needs: Not on file  Physical Activity: Not on file  Stress: Not on file  Social Connections: Not on file  Intimate Partner Violence: Not on file   Family History  Problem Relation Age of Onset   Stroke Father     Current Outpatient Medications  Medication Sig Dispense Refill   acetaminophen (TYLENOL) 325 MG tablet Take 650 mg by mouth every 4 (four) hours as needed. Reported on 12/25/2015     ASPIRIN LOW DOSE 81 MG EC tablet TAKE (1) TABLET BY MOUTH ONCE DAILY. 30 tablet 11   bacitracin ointment Apply 1 application topically 2 (two) times daily. 120 g 0   docusate sodium (COLACE) 100 MG capsule Take 1 capsule (100 mg total) by mouth every 12 (twelve) hours. Take while taking narcotic pain medication (Patient not taking: Reported on 01/05/2024) 60 capsule 0   fluPHENAZine (PROLIXIN) 2.5 MG tablet Take 7.5 mg by mouth daily.      ibuprofen (ADVIL) 600 MG tablet Take 1 tablet (600 mg total) by mouth  every 6 (six) hours as needed. 30 tablet 0   ondansetron (ZOFRAN ODT) 4 MG disintegrating tablet Take 1 tablet (4 mg total) by mouth every 8 (eight) hours as needed for nausea or vomiting. (Patient not taking: Reported on 01/05/2024) 20 tablet 0   rosuvastatin (CRESTOR) 10 MG tablet TAKE (1) TABLET BY MOUTH ONCE DAILY. 13 tablet 11   No current facility-administered medications for this visit.    No Known Allergies   REVIEW OF SYSTEMS:  [X]  denotes positive finding, [ ]  denotes negative finding Cardiac  Comments:  Chest pain or chest pressure:    Shortness of breath upon exertion:    Short of breath when lying flat:    Irregular heart rhythm:        Vascular    Pain in calf, thigh, or hip brought on by ambulation:    Pain in feet at night that wakes you up from your sleep:     Blood clot in your veins:    Leg swelling:          Pulmonary    Oxygen at home:    Productive cough:     Wheezing:         Neurologic    Sudden weakness in arms or legs:     Sudden numbness in arms or legs:     Sudden onset of difficulty speaking or slurred speech:    Temporary loss of vision in one eye:     Problems with dizziness:         Gastrointestinal    Blood in stool:     Vomited blood:         Genitourinary    Burning when urinating:     Blood in urine:        Psychiatric    Major depression:         Hematologic    Bleeding problems:    Problems with blood clotting too easily:        Skin    Rashes or ulcers:        Constitutional    Fever or chills:      PHYSICAL EXAMINATION:  There were no vitals filed for this visit.  General:  WDWN in NAD; vital signs documented above Gait: Not observed HENT: WNL, normocephalic Pulmonary: normal non-labored breathing , without wheezing Cardiac: regular HR Abdomen: soft, NT, no masses Skin: without rashes Vascular Exam/Pulses:  Right Left  Radial 2+ (normal) 2+ (normal)  Ulnar    Femoral    Popliteal    DP 2+ (normal) 2+ (normal)  PT     Extremities: without ischemic changes, without Gangrene , without cellulitis; without open wounds;  Musculoskeletal: no muscle wasting or atrophy  Neurologic: A&O X 3;  No focal weakness or paresthesias are detected Psychiatric:  The pt has Normal affect.   Non-Invasive Vascular Imaging:    Abdominal Aorta Findings:  +-----------+-------+----------+----------+--------+--------+--------+  Location  AP (cm)Trans (cm)PSV (cm/s)WaveformThrombusComments  +-----------+-------+----------+----------+--------+--------+--------+  Proximal  2.30   2.38                                          +-----------+-------+----------+----------+--------+--------+--------+  Mid       3.43   3.28      169                                 +-----------+-------+----------+----------+--------+--------+--------+  Distal     5.88   6.00      110               Present           +-----------+-------+----------+----------+--------+--------+--------+  RT CIA Prox1.0    0.9       148                                 +-----------+-------+----------+----------+--------+--------+--------+  LT CIA Prox0.7    0.8       108                                 +-----------+-------+----------+----------+--------+--------+--------+      ASSESSMENT/PLAN: Sean Lowe is a 68 y.o. male presenting with asymptomatic sick centimeter AAA.  This is grown significantly over the last 5 years.  At this size, SVS guidelines recommend elective repair.  I had a long discussion with Sean Lowe regarding the above.  I have ordered a CT angio abdomen pelvis in an effort to better define the aneurysm to assess his candidacy for endovascular versus open repair.  My plan is to see him after the CT angio has been completed, hopefully in 1 to 2 weeks.  We discussed the signs and symptoms of rupture.  I asked him to call 911 immediately should any of the symptoms occur.    Victorino Sparrow, MD [facility] [phone]

## 2024-01-18 ENCOUNTER — Other Ambulatory Visit: Payer: 59

## 2024-01-19 ENCOUNTER — Ambulatory Visit: Payer: 59 | Admitting: Vascular Surgery

## 2024-01-20 ENCOUNTER — Ambulatory Visit
Admission: RE | Admit: 2024-01-20 | Discharge: 2024-01-20 | Disposition: A | Discharge status: Home / Self Care | Payer: 59 | Source: Ambulatory Visit | Attending: Vascular Surgery | Admitting: Vascular Surgery

## 2024-01-20 DIAGNOSIS — I714 Abdominal aortic aneurysm, without rupture, unspecified: Secondary | ICD-10-CM

## 2024-01-20 DIAGNOSIS — I713 Abdominal aortic aneurysm, ruptured, unspecified: Secondary | ICD-10-CM

## 2024-01-20 MED ORDER — IOPAMIDOL (ISOVUE-370) INJECTION 76%
100.0000 mL | Freq: Once | INTRAVENOUS | Status: AC | PRN
  Administered 2024-01-20: 100 mL via INTRAVENOUS

## 2024-01-31 NOTE — Progress Notes (Unsigned)
 Office Note     HPI: Sean Lowe is a 68 y.o. ([birth year]) male presenting to discuss recent CTA of 25 m infrarenal abdominal aortic aneurysm  On exam today, Sean Lowe was doing well, accompanied by his caregiver.  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 This diagnosis was made after a breakdown in college.  He has been able to hold jobs, but now is unemployed.  Sean Lowe denies family history of AAA.  Denies claudication, ischemic rest pain, tissue loss. He spends majority of his time watching TV, he enjoys the [employer] TV channel. He stated he has no family for me to discuss his diagnosis with. Denies recent abdominal pain, back pain, chest pain  The pt is  on a statin for cholesterol management.  The pt is  on a daily aspirin.   Other AC:  - The pt is  on medication for hypertension.   The pt is not diabetic.  Tobacco hx:  -  Past Medical History:  Diagnosis Date   AAA (abdominal aortic aneurysm) (HCC)    Anxiety    Psychiatric diagnosis    Schizophrenia (HCC)     Past Surgical History:  Procedure Laterality Date   FINGER SURGERY Left    repair from injury    Social History   Socioeconomic History   Marital status: Single    Spouse name: Not on file   Number of children: Not on file   Years of education: Not on file   Highest education level: Not on file  Occupational History   Not on file  Tobacco Use   Smoking status: Every Day    Current packs/day: 1.50    Average packs/day: 1.5 packs/day for 42.0 years (63.0 ttl pk-yrs)    Types: Cigarettes   Smokeless tobacco: Never  Vaping Use   Vaping status: Never Used  Substance and Sexual Activity   Alcohol use: Yes    Alcohol/week: 0.0 standard drinks of alcohol    Comment: occasional   Drug use: No   Sexual activity: Never    Birth control/protection: Abstinence  Other Topics Concern   Not on file  Social History Narrative   Not on file   Social Drivers of Health   Financial  Resource Strain: Not on file  Food Insecurity: Not on file  Transportation Needs: Not on file  Physical Activity: Not on file  Stress: Not on file  Social Connections: Not on file  Intimate Partner Violence: Not on file   Family History  Problem Relation Age of Onset   Stroke Father     Current Outpatient Medications  Medication Sig Dispense Refill   acetaminophen (TYLENOL) 325 MG tablet Take 650 mg by mouth every 4 (four) hours as needed. Reported on 12/25/2015     ASPIRIN LOW DOSE 81 MG EC tablet TAKE (1) TABLET BY MOUTH ONCE DAILY. 30 tablet 11   bacitracin ointment Apply 1 application topically 2 (two) times daily. 120 g 0   docusate sodium (COLACE) 100 MG capsule Take 1 capsule (100 mg total) by mouth every 12 (twelve) hours. Take while taking narcotic pain medication (Patient not taking: Reported on 01/05/2024) 60 capsule 0   fluPHENAZine (PROLIXIN) 2.5 MG tablet Take 7.5 mg by mouth daily.      ibuprofen (ADVIL) 600 MG tablet Take 1 tablet (600 mg total) by mouth every 6 (six) hours as needed. 30 tablet 0   ondansetron (ZOFRAN ODT) 4 MG disintegrating tablet Take 1 tablet (4  mg total) by mouth every 8 (eight) hours as needed for nausea or vomiting. (Patient not taking: Reported on 01/05/2024) 20 tablet 0   rosuvastatin (CRESTOR) 10 MG tablet TAKE (1) TABLET BY MOUTH ONCE DAILY. 13 tablet 11   No current facility-administered medications for this visit.    No Known Allergies   REVIEW OF SYSTEMS:  [X]  denotes positive finding, [ ]  denotes negative finding Cardiac  Comments:  Chest pain or chest pressure:    Shortness of breath upon exertion:    Short of breath when lying flat:    Irregular heart rhythm:        Vascular    Pain in calf, thigh, or hip brought on by ambulation:    Pain in feet at night that wakes you up from your sleep:     Blood clot in your veins:    Leg swelling:         Pulmonary    Oxygen at home:    Productive cough:     Wheezing:          Neurologic    Sudden weakness in arms or legs:     Sudden numbness in arms or legs:     Sudden onset of difficulty speaking or slurred speech:    Temporary loss of vision in one eye:     Problems with dizziness:         Gastrointestinal    Blood in stool:     Vomited blood:         Genitourinary    Burning when urinating:     Blood in urine:        Psychiatric    Major depression:         Hematologic    Bleeding problems:    Problems with blood clotting too easily:        Skin    Rashes or ulcers:        Constitutional    Fever or chills:      PHYSICAL EXAMINATION:  There were no vitals filed for this visit.  General:  WDWN in NAD; vital signs documented above Gait: Not observed HENT: WNL, normocephalic Pulmonary: normal non-labored breathing , without wheezing Cardiac: regular HR Abdomen: soft, NT, no masses Skin: without rashes Vascular Exam/Pulses:  Right Left  Radial 2+ (normal) 2+ (normal)  Ulnar    Femoral    Popliteal    DP 2+ (normal) 2+ (normal)  PT 2+ 2+   Extremities: without ischemic changes, without Gangrene , without cellulitis; without open wounds;  Musculoskeletal: no muscle wasting or atrophy  Neurologic: A&O X 3;  No focal weakness or paresthesias are detected Psychiatric:  The pt has Normal affect.   Non-Invasive Vascular Imaging:    Abdominal Aorta Findings:  +-----------+-------+----------+----------+--------+--------+--------+  Location  AP (cm)Trans (cm)PSV (cm/s)WaveformThrombusComments  +-----------+-------+----------+----------+--------+--------+--------+  Proximal  2.30   2.38                                          +-----------+-------+----------+----------+--------+--------+--------+  Mid       3.43   3.28      169                                 +-----------+-------+----------+----------+--------+--------+--------+  Distal    5.88   6.00  110               Present            +-----------+-------+----------+----------+--------+--------+--------+  RT CIA Prox1.0    0.9       148                                 +-----------+-------+----------+----------+--------+--------+--------+  LT CIA Prox0.7    0.8       108                                 +-----------+-------+----------+----------+--------+--------+--------+    Aorta: Fusiform infrarenal abdominal aortic aneurysm measuring up to 60 mm in maximum short axis dimension, previously 52 mm. The suprarenal abdominal aorta is normal in caliber and patent. Scattered atherosclerotic calcifications.    ASSESSMENT/PLAN: Sean Lowe is a 68 y.o. male presenting with asymptomatic 6 cm AAA.  Society for vascular surgery guidelines recommend repair.  I had a long discussion with Sean Lowe and his caregiver regarding the above.  We discussed the risks and benefits of surgery. Iliac arteries are on the smaller side, therefore access is my major concern.  I think that the Gore endoprosthesis is a good choice, due to the dry seal sheaths that are used. After discussing the risks and benefits of endovascular aortic repair, Sean Lowe elected to proceed.  I plan to reach out to his sister to discuss the surgery further.  Sean Lowe will need a cardiology referral for preoperative risk assessment and optimization.  The surgery will be scheduled after.  We discussed the signs and symptoms of rupture.  I asked him to call 911 immediately should any of the symptoms occur.    Victorino Sparrow, MD [facility] [phone]

## 2024-02-02 ENCOUNTER — Encounter: Payer: Self-pay | Admitting: Vascular Surgery

## 2024-02-02 ENCOUNTER — Telehealth: Payer: Self-pay

## 2024-02-02 ENCOUNTER — Ambulatory Visit: Payer: 59 | Admitting: Vascular Surgery

## 2024-02-02 VITALS — BP 105/62 | HR 73 | Temp 98.2°F | Resp 20 | Ht 70.0 in | Wt 148.0 lb

## 2024-02-02 DIAGNOSIS — I714 Abdominal aortic aneurysm, without rupture, unspecified: Secondary | ICD-10-CM

## 2024-02-02 NOTE — Telephone Encounter (Signed)
 Pt hasn't been seen since 2021, therefore, will require a new pt referral / appointment.  Will route back to requesting surgeon's office so they can send referral over for pt to get scheduled.

## 2024-02-02 NOTE — Telephone Encounter (Signed)
   Pre-operative Risk Assessment    Patient Name: Sean Lowe  DOB: 1956-07-07 MRN: [id number]   Date of last office visit: 04/14/20 Date of next office visit: Not scheduled   Request for Surgical Clearance    Procedure:   EVAR  Date of Surgery:  Clearance TBD                                Surgeon:  Dr. Comer Locket Group or Practice Name:  [facility] Phone number:  [phone] Fax number:  [phone]   Type of Clearance Requested:   - Medical    Type of Anesthesia:  General    Additional requests/questions:    [name]   02/02/2024, 3:15 PM

## 2024-02-02 NOTE — Telephone Encounter (Signed)
   Name: Sean Lowe  DOB: 1956-04-19  MRN: [id number]  Primary Cardiologist: None  Chart reviewed as part of pre-operative protocol coverage. Because of Sean Lowe's past medical history and time since last visit, he will require a follow-up in-office visit in order to better assess preoperative cardiovascular risk.  Pre-op covering staff: - Please schedule appointment and call patient to inform them. If patient already had an upcoming appointment within acceptable timeframe, please add "pre-op clearance" to the appointment notes so provider is aware. - Please contact requesting surgeon's office via preferred method (i.e, phone, fax) to inform them of need for appointment prior to surgery.    Napoleon Form, Leodis Rains, NP  02/02/2024, 3:52 PM

## 2024-02-06 NOTE — Telephone Encounter (Addendum)
 Message sent to scheduling for new pt appt as referral has been received.   Referral Notes Number of Notes: 1 . Type Date User Summary Attachment  General 02/02/2024  2:43 PM Willaim Sheng urgent referral -  Note: Pre op clearance for EVAR

## 2024-02-07 ENCOUNTER — Telehealth: Payer: Self-pay

## 2024-02-07 NOTE — Telephone Encounter (Signed)
 Able to reach pt's sisters caregiver at [phone]. Explained to her that [facility] has been trying to reach them to schedule cardiac cx for pt's EVAR surgery. She was given their number and said she would call them to schedule appt.

## 2024-02-07 NOTE — Telephone Encounter (Signed)
 Attempted to reach pt multiple times on different phone numbers on his chart. Some of them are not in service. Pt is needing an appt at [facility] for cardiac cx prior to scheduling EVAR.

## 2024-02-08 NOTE — Telephone Encounter (Signed)
 Pt scheduled to see Robin Searing, NP, 02/09/24, clearance will be addressed at that time.  Will send to requesting surgeon's office to make them aware.

## 2024-02-09 ENCOUNTER — Ambulatory Visit: Attending: Cardiology | Admitting: Cardiology

## 2024-02-09 ENCOUNTER — Encounter: Payer: Self-pay | Admitting: Cardiology

## 2024-02-09 VITALS — BP 98/60 | HR 89 | Resp 16 | Ht 70.0 in | Wt 147.2 lb

## 2024-02-09 DIAGNOSIS — I714 Abdominal aortic aneurysm, without rupture, unspecified: Secondary | ICD-10-CM | POA: Diagnosis not present

## 2024-02-09 DIAGNOSIS — Z01818 Encounter for other preprocedural examination: Secondary | ICD-10-CM

## 2024-02-09 DIAGNOSIS — I951 Orthostatic hypotension: Secondary | ICD-10-CM | POA: Diagnosis not present

## 2024-02-09 MED ORDER — FLUDROCORTISONE ACETATE 0.1 MG PO TABS: 0.1000 mg | ORAL_TABLET | Freq: Every evening | ORAL | 1 refills | Status: AC

## 2024-02-09 NOTE — Patient Instructions (Signed)
 Medication Instructions:  Your physician recommends that you continue on your current medications as directed. Please refer to the Current Medication list given to you today.  *If you need a refill on your cardiac medications before your next appointment, please call your pharmacy*   Lab Work: none If you have labs (blood work) drawn today and your tests are completely normal, you will receive your results only by: MyChart Message (if you have MyChart) OR A paper copy in the mail If you have any lab test that is abnormal or we need to change your treatment, we will call you to review the results.   Testing/Procedures: none   Follow-Up: At [facility], you and your health needs are our priority.  As part of our continuing mission to provide you with exceptional heart care, we have created designated Provider Care Teams.  These Care Teams include your primary Cardiologist (physician) and Advanced Practice Providers (APPs -  Physician Assistants and Nurse Practitioners) who all work together to provide you with the care you need, when you need it.  We recommend signing up for the patient portal called "MyChart".  Sign up information is provided on this After Visit Summary.  MyChart is used to connect with patients for Virtual Visits (Telemedicine).  Patients are able to view lab/test results, encounter notes, upcoming appointments, etc.  Non-urgent messages can be sent to your provider as well.   To learn more about what you can do with MyChart, go to [email or web address].    Your next appointment:   2 month(s)  Provider:   Jari Favre, PA-C, Ronie Spies, PA-C, Robin Searing, NP, Jacolyn Reedy, PA-C, Eligha Bridegroom, NP, Tereso Newcomer, PA-C, or Perlie Gold, PA-C         Other Instructions

## 2024-02-09 NOTE — Progress Notes (Signed)
 Cardiology Office Note:  .   Date:  02/09/2024  ID:  Sean Lowe, DOB 14-Dec-1955, MRN [id number] PCP: Fleet Contras, MD  [facility] Providers Cardiologist:  Yates Decamp, MD   History of Present Illness: .   Sean Lowe is a 68 y.o. Caucasian male patient with borderline schizophrenia who resides in a group home, no significant prior cardiovascular history but known abdominal aortic aneurysm measuring about 5.2 cm has been scheduled for EVAR, patient referred to me for preoperative cardiac risk stratification.  Recent abdominal attic duplex on 01/04/2021 revealing expansion of the abdominal cannula to 6 cm from 5.2 cm previously, size confirmed by CT angiogram on 01/20/2024.  Patient is presently on a statin with Crestor 10 mg daily.  Patient complains of recurrent episodes of syncope.  He is accompanied by his caregiver who gives me the history, states that over the past 1 year he has had frequent episodes of syncope at least once a month.  On further questioning, patient states that he gets dizzy when he stands up.  Episodes occur mostly when he gets up and walks in the restroom and suddenly feels dizzy and passes out.  No injury reported, no loss of bladder or bowel control.  Denies chest pain, dyspnea.  He smokes about a pack of cigarettes a day.       Labs   Lab Results  Component Value Date   CHOL 109 05/25/2023   HDL 42 05/25/2023   LDLCALC 51 05/25/2023   TRIG 76 05/25/2023   CHOLHDL 2.6 05/25/2023   Lab Results  Component Value Date   NA 134 (L) 07/14/2023   K 3.9 07/14/2023   CO2 26 07/14/2023   GLUCOSE 96 07/14/2023   BUN 15 07/14/2023   CREATININE 1.24 07/14/2023   CALCIUM 9.2 07/14/2023   EGFR 65 05/25/2023   GFRNONAA >60 07/14/2023      Latest Ref Rng & Units 07/14/2023   11:56 PM 05/25/2023   12:00 AM 12/15/2022   11:32 AM  BMP  Glucose 70 - 99 mg/dL 96  829  89   BUN 8 - 23 mg/dL 15  17  14    Creatinine 0.61 - 1.24 mg/dL 5.62  1.30   8.65   BUN/Creat Ratio 6 - 22 (calc)  SEE NOTE:    Sodium 135 - 145 mmol/L 134  139  137   Potassium 3.5 - 5.1 mmol/L 3.9  4.3  4.2   Chloride 98 - 111 mmol/L 101  107  104   CO2 22 - 32 mmol/L 26  22  27    Calcium 8.9 - 10.3 mg/dL 9.2  9.3  9.0       Latest Ref Rng & Units 07/14/2023   11:56 PM 05/25/2023   12:00 AM 12/15/2022   11:32 AM  CBC  WBC 4.0 - 10.5 K/uL 7.7  7.1  6.7   Hemoglobin 13.0 - 17.0 g/dL 78.4  69.6  29.5   Hematocrit 39.0 - 52.0 % 37.5  37.1  39.4   Platelets 150 - 400 K/uL 166  186  170    No results found for: "HGBA1C"  Lab Results  Component Value Date   TSH 1.74 05/25/2023     Review of Systems  Cardiovascular:  Positive for syncope. Negative for chest pain, dyspnea on exertion, leg swelling and palpitations.   Physical Exam:   VS:  BP 98/60 (BP Location: Left Arm, Patient Position: Sitting, Cuff Size: Normal)  Pulse 89   Resp 16   Ht 5\' 10"  (1.778 m)   Wt 147 lb 3.2 oz (66.8 kg)   SpO2 98%   BMI 21.12 kg/m    Wt Readings from Last 3 Encounters:  02/09/24 147 lb 3.2 oz (66.8 kg)  02/02/24 148 lb (67.1 kg)  01/05/24 148 lb (67.1 kg)    Orthostatic VS for the past 72 hrs (Last 3 readings):  Orthostatic BP Patient Position BP Location Cuff Size  02/09/24 1523 (!) 80/55 Standing -- Normal  02/09/24 1522 98/60 Supine -- Normal  02/09/24 1437 -- Sitting Left Arm Normal    Physical Exam Neck:     Vascular: No carotid bruit or JVD.  Cardiovascular:     Rate and Rhythm: Normal rate and regular rhythm.     Pulses: Intact distal pulses.     Heart sounds: Normal heart sounds. No murmur heard.    No gallop.  Pulmonary:     Effort: Pulmonary effort is normal.     Breath sounds: Normal breath sounds.  Abdominal:     General: Bowel sounds are normal.     Palpations: Abdomen is soft. There is pulsatile mass.     Comments: Very prominent abdominal aortic pulsation felt, measures 6 cm 2 or more, nontender  Musculoskeletal:     Right lower leg: No  edema.     Left lower leg: No edema.    Studies Reviewed: Marland Kitchen    Ao Duplex and CT abdomen EKG:    EKG Interpretation Date/Time:  Thursday February 09 2024 14:43:12 EDT Ventricular Rate:  88 PR Interval:  144 QRS Duration:  74 QT Interval:  364 QTC Calculation: 440 R Axis:   65  Text Interpretation: EKG 02/09/2024: Normal sinus rhythm at rate of 88 bpm, poor R wave progression, probably normal variant.  Normal EKG. Confirmed by Delrae Rend [id number]) on 02/09/2024 3:29:05 PM    Medications and allergies    No Known Allergies   Current Outpatient Medications:    acetaminophen (TYLENOL) 325 MG tablet, Take 650 mg by mouth every 4 (four) hours as needed. Reported on 12/25/2015, Disp: , Rfl:    ASPIRIN LOW DOSE 81 MG EC tablet, TAKE (1) TABLET BY MOUTH ONCE DAILY., Disp: 30 tablet, Rfl: 11   fludrocortisone (FLORINEF) 0.1 MG tablet, Take 1 tablet (0.1 mg total) by mouth every evening., Disp: 90 tablet, Rfl: 1   fluPHENAZine (PROLIXIN) 2.5 MG tablet, Take 7.5 mg by mouth daily. , Disp: , Rfl:    NOREL AD 4-10-325 MG TABS, Take 1 tablet by mouth 2 (two) times daily., Disp: , Rfl:    rosuvastatin (CRESTOR) 10 MG tablet, TAKE (1) TABLET BY MOUTH ONCE DAILY., Disp: 13 tablet, Rfl: 11   ASSESSMENT AND PLAN: .      ICD-10-CM   1. Abdominal aortic aneurysm (AAA) without rupture, unspecified part (HCC)  I71.40     2. Pre-op evaluation  Z01.818 EKG 12-Lead    3. Orthostatic hypotension  I95.1 fludrocortisone (FLORINEF) 0.1 MG tablet      1. Pre-op evaluation Patient scheduled for EVAR, has a 6 cm abdominal attic aneurysm.  He is asymptomatic without any chest pain or dyspnea, normal physical exam from cardiac exam except for a very pulsatile very large abdominal attic aneurysm that can easily be felt by physical exam as well which is nontender.  He is EKG is normal as well.  Hence he can be taken up for the upcoming surgery with low risk.  He is already on a statin with 10 mg of Crestor with  well-controlled lipid status as well along with aspirin 81 mg daily, continue same.  He can be taken up for the upcoming surgery with low risk.  - EKG 12-Lead  2. Abdominal aortic aneurysm (AAA) without rupture, unspecified part (HCC) (Primary) As dictated above I reviewed both his CT angiogram of the abdomen and also abdominal attic duplex.  3. Orthostatic hypotension Patient's episodes of syncope is related to severe orthostatic hypotension.  His blood pressure dropped to 78 mmHg on standing, and had associated marked dizziness as well.  He has not had any injury with recurrent episodes of syncope.  I have sent a note to the group home regarding being liberal with salt intake, use of support stockings at 20 to 30 mmHg while awake and I am starting him on fludrocortisone 0.1 mg in the evening and he may need a higher dose depending upon his response.  I would like to see him back with either NP or PA in 2 months to follow-up specifically on orthostatic hypotension, if he remains stable I will see him back on a as needed basis.  I do not think he needs any cardiac workup including echocardiogram or stress testing.  I will send a note to Dr. Ivin Booty Theone [facility]) Sherral Hammers, vascular surgery.  - fludrocortisone (FLORINEF) 0.1 MG tablet; Take 1 tablet (0.1 mg total) by mouth every evening.  Dispense: 90 tablet; Refill: 1  Other orders - NOREL AD 4-10-325 MG TABS; Take 1 tablet by mouth 2 (two) times daily.  Signed,  Yates Decamp, MD, [facility] 02/09/2024, 3:36 PM [facility] [address] [place], [place] [address] Phone: [phone]. Fax:  [phone]

## 2024-02-10 ENCOUNTER — Other Ambulatory Visit: Payer: Self-pay

## 2024-02-10 ENCOUNTER — Encounter (HOSPITAL_COMMUNITY): Payer: Self-pay | Admitting: Vascular Surgery

## 2024-02-10 DIAGNOSIS — I714 Abdominal aortic aneurysm, without rupture, unspecified: Secondary | ICD-10-CM

## 2024-02-10 NOTE — Progress Notes (Signed)
 Pt does not have a legal guardian and is able to sign his own consent. His caregiver will be with him the morning of surgery.   PCP - Lianne Cure, MD Cardiologist - Yates Decamp, MD  EKG - 02/09/24  Aspirin Instructions: Continue  Anesthesia review: Y-Has cardiac clearance from 02/09/24  Patient verbally denies any shortness of breath, fever, cough and chest pain during phone call   -------------  SDW INSTRUCTIONS given:  Your procedure is scheduled on Monday 17th.  Report to [facility] Main Entrance "A" at 0530 A.M., and check in at the Admitting office.  Call this number if you have problems the morning of surgery:  [phone]   Remember:  Do not eat or drink after midnight the night before your surgery    Take these medicines the morning of surgery with A SIP OF WATER  ASPIRIN  acetaminophen (TYLENOL)-if needed  As of today, STOP taking any Aspirin (unless otherwise instructed by your surgeon) Aleve, Naproxen, Ibuprofen, Motrin, Advil, Goody's, BC's, all herbal medications, fish oil, and all vitamins.                      Do not wear jewelry, make up, or nail polish            Do not wear lotions, powders, perfumes/colognes, or deodorant.            Do not shave 48 hours prior to surgery.  Men may shave face and neck.            Do not bring valuables to the hospital.            [facility] is not responsible for any belongings or valuables.  Do NOT Smoke (Tobacco/Vaping) 24 hours prior to your procedure If you use a CPAP at night, you may bring all equipment for your overnight stay.   Contacts, glasses, dentures or bridgework may not be worn into surgery.      For patients admitted to the hospital, discharge time will be determined by your treatment team.   Patients discharged the day of surgery will not be allowed to drive home, and someone needs to stay with them for 24 hours.    Special instructions:   [facility]- Preparing For Surgery  Before surgery, you  can play an important role. Because skin is not sterile, your skin needs to be as free of germs as possible. You can reduce the number of germs on your skin by washing with CHG (chlorahexidine gluconate) Soap before surgery.  CHG is an antiseptic cleaner which kills germs and bonds with the skin to continue killing germs even after washing.    Oral Hygiene is also important to reduce your risk of infection.  Remember - BRUSH YOUR TEETH THE MORNING OF SURGERY WITH YOUR REGULAR TOOTHPASTE  Please do not use if you have an allergy to CHG or antibacterial soaps. If your skin becomes reddened/irritated stop using the CHG.  Do not shave (including legs and underarms) for at least 48 hours prior to first CHG shower. It is OK to shave your face.  Please follow these instructions carefully.   Shower the NIGHT BEFORE SURGERY and the MORNING OF SURGERY with DIAL Soap.   Pat yourself dry with a CLEAN TOWEL.  Wear CLEAN PAJAMAS to bed the night before surgery  Place CLEAN SHEETS on your bed the night of your first shower and DO NOT SLEEP WITH PETS.   Day of Surgery:  Please shower morning of surgery  Wear Clean/Comfortable clothing the morning of surgery Do not apply any deodorants/lotions.   Remember to brush your teeth WITH YOUR REGULAR TOOTHPASTE.   Questions were answered. Patient verbalized understanding of instructions.

## 2024-02-10 NOTE — Anesthesia Preprocedure Evaluation (Addendum)
 Anesthesia Evaluation  Patient identified by MRN, date of birth, ID band Patient awake    Reviewed: Allergy & Precautions, NPO status , Patient's Chart, lab work & pertinent test results  Airway Mallampati: II  TM Distance: >3 FB Neck ROM: Full    Dental  (+) Dental Advisory Given   Pulmonary Current Smoker   Pulmonary exam normal breath sounds clear to auscultation       Cardiovascular + Peripheral Vascular Disease  Normal cardiovascular exam Rhythm:Regular Rate:Normal     Neuro/Psych  Headaches PSYCHIATRIC DISORDERS Anxiety   Schizophrenia     GI/Hepatic negative GI ROS, Neg liver ROS,,,  Endo/Other  negative endocrine ROS    Renal/GU negative Renal ROS     Musculoskeletal negative musculoskeletal ROS (+)    Abdominal   Peds  Hematology negative hematology ROS (+)   Anesthesia Other Findings   Reproductive/Obstetrics                              Anesthesia Physical Anesthesia Plan  ASA: 4  Anesthesia Plan: General   Post-op Pain Management: Tylenol PO (pre-op)*   Induction: Intravenous  PONV Risk Score and Plan: 1 and Dexamethasone, Ondansetron and Treatment may vary due to age or medical condition  Airway Management Planned: Oral ETT  Additional Equipment: Arterial line  Intra-op Plan:   Post-operative Plan: Extubation in OR  Informed Consent: I have reviewed the patients History and Physical, chart, labs and discussed the procedure including the risks, benefits and alternatives for the proposed anesthesia with the patient or authorized representative who has indicated his/her understanding and acceptance.     Dental advisory given  Plan Discussed with: CRNA  Anesthesia Plan Comments: (2 large bore PIVs  PAT note by Antionette Poles, PA-C: 68 year old male current smoker with borderline schizophrenia who resides in a group home, no sniffing a prior cardiovascular  history but known abdominal aortic aneurysm measuring 6.0 cm was referred to cardiologist Dr. Jacinto Halim for preoperative risk stratification.  He was seen 02/09/2024 and noted to have orthostatic hypotension with occasional episodes of syncope.  Dr. Jacinto Halim noted that his blood pressure dropped to 78 mmHg on standing and he had associated marked dizziness as well.  A note was sent to his group home regarding being liberal with salt intake and using support stockings.  He was also started on fludrocortisone 0.1 mg in the evening with plan to titrate if necessary.  Regarding surgery, Dr. Jacinto Halim stated, "Patient scheduled for EVAR, has a 6 cm abdominal attic aneurysm. He is asymptomatic without any chest pain or dyspnea, normal physical exam from cardiac exam except for a very pulsatile very large abdominal attic aneurysm that can easily be felt by physical exam as well which is nontender. He is EKG is normal as well. Hence he can be taken up for the upcoming surgery with low risk. He is already on a statin with 10 mg of Crestor with well-controlled lipid status as well along with aspirin 81 mg daily, continue same. He can be taken up for the upcoming surgery with low risk."  Patient will need day of surgery labs and evaluation.  EKG 02/09/2024: Normal sinus rhythm at rate of 88 bpm, poor R wave progression, probably normal variant   )         Anesthesia Quick Evaluation

## 2024-02-10 NOTE — Progress Notes (Signed)
 Anesthesia Chart Review: Same day workup  68 year old male current smoker with borderline schizophrenia who resides in a group home, no sniffing a prior cardiovascular history but known abdominal aortic aneurysm measuring 6.0 cm was referred to cardiologist Dr. Jacinto Halim for preoperative risk stratification.  He was seen 02/09/2024 and noted to have orthostatic hypotension with occasional episodes of syncope.  Dr. Jacinto Halim noted that his blood pressure dropped to 78 mmHg on standing and he had associated marked dizziness as well.  A note was sent to his group home regarding being liberal with salt intake and using support stockings.  He was also started on fludrocortisone 0.1 mg in the evening with plan to titrate if necessary.  Regarding surgery, Dr. Jacinto Halim stated, "Patient scheduled for EVAR, has a 6 cm abdominal attic aneurysm. He is asymptomatic without any chest pain or dyspnea, normal physical exam from cardiac exam except for a very pulsatile very large abdominal attic aneurysm that can easily be felt by physical exam as well which is nontender. He is EKG is normal as well. Hence he can be taken up for the upcoming surgery with low risk. He is already on a statin with 10 mg of Crestor with well-controlled lipid status as well along with aspirin 81 mg daily, continue same. He can be taken up for the upcoming surgery with low risk."  Patient will need day of surgery labs and evaluation.  EKG 02/09/2024: Normal sinus rhythm at rate of 88 bpm, poor R wave progression, probably normal variant     Zannie Cove [facility] Stay [facility]/Anesthesiology Phone [phone] 02/10/2024 2:57 PM

## 2024-02-13 ENCOUNTER — Inpatient Hospital Stay (HOSPITAL_COMMUNITY)

## 2024-02-13 ENCOUNTER — Inpatient Hospital Stay (HOSPITAL_COMMUNITY)
Admission: RE | Admit: 2024-02-13 | Discharge: 2024-02-14 | DRG: 269 | Disposition: A | Discharge status: Home / Self Care | Attending: Vascular Surgery | Admitting: Vascular Surgery

## 2024-02-13 ENCOUNTER — Inpatient Hospital Stay (HOSPITAL_COMMUNITY): Payer: Self-pay | Admitting: Physician Assistant

## 2024-02-13 ENCOUNTER — Other Ambulatory Visit: Payer: Self-pay

## 2024-02-13 ENCOUNTER — Encounter (HOSPITAL_COMMUNITY)
Admission: RE | Disposition: A | Discharge status: Nursing Home (Includes ICF) | Payer: Self-pay | Source: Ambulatory Visit | Attending: Vascular Surgery

## 2024-02-13 DIAGNOSIS — I714 Abdominal aortic aneurysm, without rupture, unspecified: Principal | ICD-10-CM

## 2024-02-13 DIAGNOSIS — Z79899 Other long term (current) drug therapy: Secondary | ICD-10-CM

## 2024-02-13 DIAGNOSIS — I10 Essential (primary) hypertension: Secondary | ICD-10-CM | POA: Diagnosis present

## 2024-02-13 DIAGNOSIS — I7143 Infrarenal abdominal aortic aneurysm, without rupture: Secondary | ICD-10-CM | POA: Diagnosis present

## 2024-02-13 DIAGNOSIS — Z823 Family history of stroke: Secondary | ICD-10-CM | POA: Diagnosis not present

## 2024-02-13 DIAGNOSIS — F17210 Nicotine dependence, cigarettes, uncomplicated: Secondary | ICD-10-CM | POA: Diagnosis present

## 2024-02-13 DIAGNOSIS — F419 Anxiety disorder, unspecified: Secondary | ICD-10-CM | POA: Diagnosis present

## 2024-02-13 DIAGNOSIS — F209 Schizophrenia, unspecified: Secondary | ICD-10-CM | POA: Diagnosis present

## 2024-02-13 HISTORY — DX: Dyspnea, unspecified: R06.00

## 2024-02-13 HISTORY — PX: ULTRASOUND GUIDANCE FOR VASCULAR ACCESS: SHX6516

## 2024-02-13 HISTORY — DX: Orthostatic hypotension: I95.1

## 2024-02-13 HISTORY — PX: ABDOMINAL AORTIC ENDOVASCULAR STENT GRAFT: SHX5707

## 2024-02-13 HISTORY — DX: Headache, unspecified: R51.9

## 2024-02-13 HISTORY — DX: COVID-19: U07.1

## 2024-02-13 LAB — BASIC METABOLIC PANEL
Anion gap: 9 (ref 5–15)
BUN: 18 mg/dL (ref 8–23)
CO2: 23 mmol/L (ref 22–32)
Calcium: 8.7 mg/dL — ABNORMAL LOW (ref 8.9–10.3)
Chloride: 105 mmol/L (ref 98–111)
Creatinine, Ser: 1.22 mg/dL (ref 0.61–1.24)
GFR, Estimated: >60 mL/min (ref >60)
Glucose, Bld: 101 mg/dL — ABNORMAL HIGH (ref 70–99)
Potassium: 4 mmol/L (ref 3.5–5.1)
Sodium: 137 mmol/L (ref 135–145)

## 2024-02-13 LAB — URINALYSIS, ROUTINE W REFLEX MICROSCOPIC
Bilirubin Urine: NEGATIVE
Glucose, UA: NEGATIVE mg/dL
Ketones, ur: NEGATIVE mg/dL
Leukocytes,Ua: NEGATIVE
Nitrite: NEGATIVE
Protein, ur: NEGATIVE mg/dL
Specific Gravity, Urine: 1.009 (ref 1.005–1.030)
pH: 6 (ref 5.0–8.0)

## 2024-02-13 LAB — CBC
HCT: 33.8 % — ABNORMAL LOW (ref 39.0–52.0)
HCT: 34.3 % — ABNORMAL LOW (ref 39.0–52.0)
Hemoglobin: 11 g/dL — ABNORMAL LOW (ref 13.0–17.0)
Hemoglobin: 11.4 g/dL — ABNORMAL LOW (ref 13.0–17.0)
MCH: 31.3 pg (ref 26.0–34.0)
MCH: 31.8 pg (ref 26.0–34.0)
MCHC: 32.5 g/dL (ref 30.0–36.0)
MCHC: 33.2 g/dL (ref 30.0–36.0)
MCV: 96 fL (ref 80.0–100.0)
MCV: 95.8 fL (ref 80.0–100.0)
Platelets: 136 10*3/uL — ABNORMAL LOW (ref 150–400)
Platelets: 142 10*3/uL — ABNORMAL LOW (ref 150–400)
RBC: 3.52 MIL/uL — ABNORMAL LOW (ref 4.22–5.81)
RBC: 3.58 MIL/uL — ABNORMAL LOW (ref 4.22–5.81)
RDW: 12.6 % (ref 11.5–15.5)
RDW: 12.5 % (ref 11.5–15.5)
WBC: 7.8 10*3/uL (ref 4.0–10.5)
WBC: 7.5 10*3/uL (ref 4.0–10.5)
nRBC: 0 % (ref 0.0–0.2)
nRBC: 0 % (ref 0.0–0.2)

## 2024-02-13 LAB — APTT: aPTT: 41 s — ABNORMAL HIGH (ref 24–36)

## 2024-02-13 LAB — COMPREHENSIVE METABOLIC PANEL

## 2024-02-13 LAB — POCT ACTIVATED CLOTTING TIME
Activated Clotting Time: 222 s
Activated Clotting Time: 233 s

## 2024-02-13 LAB — CREATININE, SERUM
Creatinine, Ser: 1.33 mg/dL — ABNORMAL HIGH (ref 0.61–1.24)
GFR, Estimated: 58 mL/min — ABNORMAL LOW (ref >60)

## 2024-02-13 LAB — PROTIME-INR
INR: 1.3 — ABNORMAL HIGH (ref 0.8–1.2)
Prothrombin Time: 16.4 s — ABNORMAL HIGH (ref 11.4–15.2)

## 2024-02-13 LAB — MAGNESIUM: Magnesium: 1.8 mg/dL (ref 1.7–2.4)

## 2024-02-13 LAB — SURGICAL PCR SCREEN
MRSA, PCR: NEGATIVE
Staphylococcus aureus: POSITIVE — AB

## 2024-02-13 LAB — TYPE AND SCREEN
ABO/RH(D): A POS
Antibody Screen: NEGATIVE

## 2024-02-13 LAB — ABO/RH: ABO/RH(D): A POS

## 2024-02-13 SURGERY — INSERTION, ENDOVASCULAR STENT GRAFT, AORTA, ABDOMINAL
Anesthesia: General | Site: Groin

## 2024-02-13 MED ORDER — FLUDROCORTISONE ACETATE 0.1 MG PO TABS
0.1000 mg | ORAL_TABLET | Freq: Every evening | ORAL | Status: DC
  Administered 2024-02-13 – 2024-02-14 (×2): 0.1 mg via ORAL
  Filled 2024-02-13 (×2): qty 1

## 2024-02-13 MED ORDER — CEFAZOLIN SODIUM-DEXTROSE 2-4 GM/100ML-% IV SOLN
INTRAVENOUS | Status: AC
  Filled 2024-02-13: qty 100

## 2024-02-13 MED ORDER — HEPARIN SODIUM (PORCINE) 1000 UNIT/ML IJ SOLN
INTRAMUSCULAR | Status: DC | PRN
  Administered 2024-02-13 (×2): 3000 [IU] via INTRAVENOUS
  Administered 2024-02-13 (×2): 2000 [IU] via INTRAVENOUS

## 2024-02-13 MED ORDER — LIDOCAINE 2% (20 MG/ML) 5 ML SYRINGE
INTRAMUSCULAR | Status: DC | PRN
  Administered 2024-02-13: 60 mg via INTRAVENOUS

## 2024-02-13 MED ORDER — CLEVIDIPINE BUTYRATE 0.5 MG/ML IV EMUL
INTRAVENOUS | Status: DC | PRN
  Administered 2024-02-13: 3 mg/h via INTRAVENOUS

## 2024-02-13 MED ORDER — CHLORHEXIDINE GLUCONATE 0.12 % MT SOLN
OROMUCOSAL | Status: AC
  Administered 2024-02-13: 15 mL via OROMUCOSAL
  Filled 2024-02-13: qty 15

## 2024-02-13 MED ORDER — ORAL CARE MOUTH RINSE: 15.0000 mL | Freq: Once | OROMUCOSAL | Status: AC

## 2024-02-13 MED ORDER — SODIUM CHLORIDE 0.9 % IV SOLN: 500.0000 mL | Freq: Once | INTRAVENOUS | Status: DC | PRN

## 2024-02-13 MED ORDER — ONDANSETRON HCL 4 MG/2ML IJ SOLN: 4.0000 mg | Freq: Four times a day (QID) | INTRAMUSCULAR | Status: DC | PRN

## 2024-02-13 MED ORDER — ONDANSETRON HCL 4 MG/2ML IJ SOLN: 4.0000 mg | Freq: Once | INTRAMUSCULAR | Status: DC | PRN

## 2024-02-13 MED ORDER — ROSUVASTATIN CALCIUM 5 MG PO TABS
10.0000 mg | ORAL_TABLET | Freq: Every day | ORAL | Status: DC
Start: 2024-02-13 — End: 2024-02-14
  Administered 2024-02-13 – 2024-02-14 (×2): 10 mg via ORAL
  Filled 2024-02-13 (×2): qty 2

## 2024-02-13 MED ORDER — SUGAMMADEX SODIUM 200 MG/2ML IV SOLN
INTRAVENOUS | Status: DC | PRN
  Administered 2024-02-13: 127 mg via INTRAVENOUS

## 2024-02-13 MED ORDER — IODIXANOL 320 MG/ML IV SOLN
INTRAVENOUS | Status: DC | PRN
  Administered 2024-02-13: 100 mL via INTRA_ARTERIAL

## 2024-02-13 MED ORDER — ROCURONIUM BROMIDE 10 MG/ML (PF) SYRINGE
PREFILLED_SYRINGE | INTRAVENOUS | Status: DC | PRN
  Administered 2024-02-13: 60 mg via INTRAVENOUS

## 2024-02-13 MED ORDER — ALUM & MAG HYDROXIDE-SIMETH 200-200-20 MG/5ML PO SUSP: 15.0000 mL | ORAL | Status: DC | PRN

## 2024-02-13 MED ORDER — PHENYLEPHRINE HCL-NACL 20-0.9 MG/250ML-% IV SOLN
INTRAVENOUS | Status: DC | PRN
  Administered 2024-02-13: 50 ug/min via INTRAVENOUS

## 2024-02-13 MED ORDER — FENTANYL CITRATE (PF) 250 MCG/5ML IJ SOLN
INTRAMUSCULAR | Status: AC
  Filled 2024-02-13: qty 5

## 2024-02-13 MED ORDER — LABETALOL HCL 5 MG/ML IV SOLN: 10.0000 mg | INTRAVENOUS | Status: DC | PRN

## 2024-02-13 MED ORDER — ASPIRIN 81 MG PO TBEC
81.0000 mg | DELAYED_RELEASE_TABLET | Freq: Every day | ORAL | Status: DC
  Administered 2024-02-14: 81 mg via ORAL
  Filled 2024-02-13: qty 1

## 2024-02-13 MED ORDER — HEPARIN SODIUM (PORCINE) 5000 UNIT/ML IJ SOLN
5000.0000 [IU] | Freq: Three times a day (TID) | INTRAMUSCULAR | Status: DC
Start: 2024-02-14 — End: 2024-02-14
  Administered 2024-02-14: 5000 [IU] via SUBCUTANEOUS
  Filled 2024-02-13: qty 1

## 2024-02-13 MED ORDER — PROTAMINE SULFATE 10 MG/ML IV SOLN
INTRAVENOUS | Status: DC | PRN
  Administered 2024-02-13: 50 mg via INTRAVENOUS

## 2024-02-13 MED ORDER — ACETAMINOPHEN 325 MG PO TABS
325.0000 mg | ORAL_TABLET | ORAL | Status: DC | PRN
  Administered 2024-02-13 – 2024-02-14 (×2): 650 mg via ORAL
  Filled 2024-02-13 (×2): qty 2

## 2024-02-13 MED ORDER — SODIUM CHLORIDE 0.9 % IV SOLN: INTRAVENOUS | Status: AC

## 2024-02-13 MED ORDER — HYDROMORPHONE HCL 1 MG/ML IJ SOLN: 0.5000 mg | INTRAMUSCULAR | Status: DC | PRN

## 2024-02-13 MED ORDER — HYDRALAZINE HCL 20 MG/ML IJ SOLN: 5.0000 mg | INTRAMUSCULAR | Status: DC | PRN

## 2024-02-13 MED ORDER — ACETAMINOPHEN 500 MG PO TABS: 1000.0000 mg | ORAL_TABLET | Freq: Once | ORAL | Status: AC

## 2024-02-13 MED ORDER — ACETAMINOPHEN 650 MG RE SUPP: 325.0000 mg | RECTAL | Status: DC | PRN

## 2024-02-13 MED ORDER — DOCUSATE SODIUM 100 MG PO CAPS
100.0000 mg | ORAL_CAPSULE | Freq: Every day | ORAL | Status: DC
  Administered 2024-02-14: 100 mg via ORAL
  Filled 2024-02-13: qty 1

## 2024-02-13 MED ORDER — CHLORHEXIDINE GLUCONATE CLOTH 2 % EX PADS
6.0000 | MEDICATED_PAD | Freq: Once | CUTANEOUS | Status: DC
Start: 2024-02-13 — End: 2024-02-13

## 2024-02-13 MED ORDER — DEXAMETHASONE SODIUM PHOSPHATE 10 MG/ML IJ SOLN
INTRAMUSCULAR | Status: DC | PRN
  Administered 2024-02-13: 10 mg via INTRAVENOUS

## 2024-02-13 MED ORDER — MAGNESIUM SULFATE 2 GM/50ML IV SOLN: 2.0000 g | Freq: Every day | INTRAVENOUS | Status: DC | PRN

## 2024-02-13 MED ORDER — LABETALOL HCL 5 MG/ML IV SOLN
INTRAVENOUS | Status: AC
  Filled 2024-02-13: qty 4

## 2024-02-13 MED ORDER — CEFAZOLIN SODIUM-DEXTROSE 2-4 GM/100ML-% IV SOLN
2.0000 g | Freq: Three times a day (TID) | INTRAVENOUS | Status: AC
  Administered 2024-02-13 – 2024-02-14 (×2): 2 g via INTRAVENOUS
  Filled 2024-02-13 (×2): qty 100

## 2024-02-13 MED ORDER — FENTANYL CITRATE (PF) 250 MCG/5ML IJ SOLN
INTRAMUSCULAR | Status: DC | PRN
Start: 2024-02-13 — End: 2024-02-13
  Administered 2024-02-13: 100 ug via INTRAVENOUS

## 2024-02-13 MED ORDER — OXYCODONE-ACETAMINOPHEN 5-325 MG PO TABS: 1.0000 | ORAL_TABLET | ORAL | Status: DC | PRN

## 2024-02-13 MED ORDER — PHENYLEPHRINE 80 MCG/ML (10ML) SYRINGE FOR IV PUSH (FOR BLOOD PRESSURE SUPPORT)
PREFILLED_SYRINGE | INTRAVENOUS | Status: DC | PRN
  Administered 2024-02-13: 80 ug via INTRAVENOUS
  Administered 2024-02-13: 40 ug via INTRAVENOUS

## 2024-02-13 MED ORDER — FLUPHENAZINE HCL 5 MG PO TABS
15.0000 mg | ORAL_TABLET | Freq: Every day | ORAL | Status: DC
  Administered 2024-02-13 – 2024-02-14 (×2): 15 mg via ORAL
  Filled 2024-02-13 (×2): qty 3

## 2024-02-13 MED ORDER — POTASSIUM CHLORIDE CRYS ER 20 MEQ PO TBCR: 20.0000 meq | EXTENDED_RELEASE_TABLET | Freq: Every day | ORAL | Status: DC | PRN

## 2024-02-13 MED ORDER — CEFAZOLIN SODIUM-DEXTROSE 2-4 GM/100ML-% IV SOLN
2.0000 g | INTRAVENOUS | Status: AC
  Administered 2024-02-13: 2 g via INTRAVENOUS

## 2024-02-13 MED ORDER — METOPROLOL TARTRATE 5 MG/5ML IV SOLN: 2.0000 mg | INTRAVENOUS | Status: DC | PRN

## 2024-02-13 MED ORDER — SENNOSIDES-DOCUSATE SODIUM 8.6-50 MG PO TABS: 1.0000 | ORAL_TABLET | Freq: Every evening | ORAL | Status: DC | PRN

## 2024-02-13 MED ORDER — CHLORHEXIDINE GLUCONATE CLOTH 2 % EX PADS: 6.0000 | MEDICATED_PAD | Freq: Once | CUTANEOUS | Status: DC

## 2024-02-13 MED ORDER — ONDANSETRON HCL 4 MG/2ML IJ SOLN
INTRAMUSCULAR | Status: DC | PRN
  Administered 2024-02-13: 4 mg via INTRAVENOUS

## 2024-02-13 MED ORDER — PANTOPRAZOLE SODIUM 40 MG PO TBEC
40.0000 mg | DELAYED_RELEASE_TABLET | Freq: Every day | ORAL | Status: DC
Start: 2024-02-13 — End: 2024-02-14
  Administered 2024-02-13 – 2024-02-14 (×2): 40 mg via ORAL
  Filled 2024-02-13 (×2): qty 1

## 2024-02-13 MED ORDER — LACTATED RINGERS IV SOLN: INTRAVENOUS | Status: DC

## 2024-02-13 MED ORDER — FENTANYL CITRATE (PF) 100 MCG/2ML IJ SOLN: 25.0000 ug | INTRAMUSCULAR | Status: DC | PRN

## 2024-02-13 MED ORDER — CHLORPHEN-PE-ACETAMINOPHEN 4-10-325 MG PO TABS: 1.0000 | ORAL_TABLET | Freq: Two times a day (BID) | ORAL | Status: DC | PRN

## 2024-02-13 MED ORDER — LABETALOL HCL 5 MG/ML IV SOLN
INTRAVENOUS | Status: DC | PRN
  Administered 2024-02-13: 20 mg via INTRAVENOUS

## 2024-02-13 MED ORDER — HEPARIN 6000 UNIT IRRIGATION SOLUTION
Status: DC | PRN
  Administered 2024-02-13: 1

## 2024-02-13 MED ORDER — GUAIFENESIN-DM 100-10 MG/5ML PO SYRP: 15.0000 mL | ORAL_SOLUTION | ORAL | Status: DC | PRN

## 2024-02-13 MED ORDER — PHENOL 1.4 % MT LIQD: 1.0000 | OROMUCOSAL | Status: DC | PRN

## 2024-02-13 MED ORDER — PROPOFOL 10 MG/ML IV BOLUS
INTRAVENOUS | Status: DC | PRN
  Administered 2024-02-13: 880 mg via INTRAVENOUS

## 2024-02-13 MED ORDER — ACETAMINOPHEN 500 MG PO TABS
ORAL_TABLET | ORAL | Status: AC
  Administered 2024-02-13: 1000 mg via ORAL
  Filled 2024-02-13: qty 2

## 2024-02-13 MED ORDER — CHLORHEXIDINE GLUCONATE 0.12 % MT SOLN: 15.0000 mL | Freq: Once | OROMUCOSAL | Status: AC

## 2024-02-13 MED ORDER — SODIUM CHLORIDE 0.9 % IV SOLN: INTRAVENOUS | Status: DC

## 2024-02-13 SURGICAL SUPPLY — 49 items
APPLIER CLIP 9.375 SM OPEN (CLIP) ×2 IMPLANT
BAG COUNTER SPONGE SURGICOUNT (BAG) ×2 IMPLANT
CANISTER SUCT 3000ML PPV (MISCELLANEOUS) ×2 IMPLANT
CATH BEACON 5.038 65CM KMP-01 (CATHETERS) ×2 IMPLANT
CATH OMNI FLUSH .035X70CM (CATHETERS) ×2 IMPLANT
CATH SOFTOUCH MOTARJEME 5F (CATHETERS) IMPLANT
CATH VANSCH 5FR 6CM (CATHETERS) IMPLANT
CLIP APPLIE 9.375 SM OPEN (CLIP) ×2 IMPLANT
CLIP TI MEDIUM 6 (CLIP) ×2 IMPLANT
CLIP TI WIDE RED SMALL 6 (CLIP) ×2 IMPLANT
DERMABOND ADVANCED .7 DNX12 (GAUZE/BANDAGES/DRESSINGS) ×2 IMPLANT
DERMABOND ADVANCED .7 DNX6 (GAUZE/BANDAGES/DRESSINGS) IMPLANT
DEVICE CLOSURE PERCLS PRGLD 6F (VASCULAR PRODUCTS) ×8 IMPLANT
DRSG TEGADERM 2-3/8X2-3/4 SM (GAUZE/BANDAGES/DRESSINGS) ×4 IMPLANT
ELECT REM PT RETURN 9FT ADLT (ELECTROSURGICAL) ×4 IMPLANT
ELECTRODE REM PT RTRN 9FT ADLT (ELECTROSURGICAL) ×4 IMPLANT
EXCLDR TRNK 28.5X14.5X12 16F (Endovascular Graft) ×2 IMPLANT
EXCLUDER TNK 28.5X14.5X12 16F (Endovascular Graft) IMPLANT
EXTENDER ENDOPROSTHESIS 12X7 (Endovascular Graft) IMPLANT
GAUZE SPONGE 2X2 8PLY STRL LF (GAUZE/BANDAGES/DRESSINGS) ×2 IMPLANT
GLIDEWIRE ADV .035X180CM (WIRE) ×4 IMPLANT
GLOVE BIOGEL PI IND STRL 8 (GLOVE) ×2 IMPLANT
GOWN STRL REUS W/ TWL LRG LVL3 (GOWN DISPOSABLE) ×6 IMPLANT
GOWN STRL REUS W/TWL 2XL LVL3 (GOWN DISPOSABLE) ×4 IMPLANT
GRAFT BALLN CATH 65CM (BALLOONS) ×2 IMPLANT
KIT BASIN OR (CUSTOM PROCEDURE TRAY) ×2 IMPLANT
KIT TURNOVER KIT B (KITS) ×2 IMPLANT
NS IRRIG 1000ML POUR BTL (IV SOLUTION) ×2 IMPLANT
PACK ENDOVASCULAR (PACKS) ×2 IMPLANT
PAD ARMBOARD POSITIONER FOAM (MISCELLANEOUS) ×4 IMPLANT
PERCLOSE PROGLIDE 6F (VASCULAR PRODUCTS) ×8 IMPLANT
SET MICROPUNCTURE 5F STIFF (MISCELLANEOUS) ×2 IMPLANT
SHEATH BRITE TIP 8FR 23CM (SHEATH) ×2 IMPLANT
SHEATH DRYSEAL FLEX 12FR 33CM (SHEATH) IMPLANT
SHEATH DRYSEAL FLEX 16FR 33CM (SHEATH) IMPLANT
SHEATH PINNACLE 8F 10CM (SHEATH) ×2 IMPLANT
STENT GRAFT CONTRALAT 16X12X14 (Vascular Products) IMPLANT
STENT GRAFT CONTRALAT 16X13.5 (Endovascular Graft) IMPLANT
STOPCOCK MORSE 400PSI 3WAY (MISCELLANEOUS) ×2 IMPLANT
SUT MNCRL AB 4-0 PS2 18 (SUTURE) ×4 IMPLANT
SUT PROLENE 5 0 C 1 24 (SUTURE) IMPLANT
SUT VIC AB 2-0 CTX 36 (SUTURE) IMPLANT
SUT VIC AB 3-0 SH 27X BRD (SUTURE) IMPLANT
SYR 20ML LL LF (SYRINGE) ×2 IMPLANT
TOWEL GREEN STERILE (TOWEL DISPOSABLE) ×2 IMPLANT
TRAY FOLEY MTR SLVR 16FR STAT (SET/KITS/TRAYS/PACK) ×2 IMPLANT
TUBING HIGH PRESSURE 120CM (CONNECTOR) ×2 IMPLANT
WIRE AMPLATZ SS-J .035X180CM (WIRE) IMPLANT
WIRE BENTSON .035X145CM (WIRE) ×4 IMPLANT

## 2024-02-13 NOTE — Anesthesia Postprocedure Evaluation (Signed)
 Anesthesia Post Note  Patient: Sean Lowe  Procedure(s) Performed: INSERTION, ENDOVASCULAR STENT GRAFT, AORTA, ABDOMINAL ULTRASOUND GUIDANCE, FOR VASCULAR ACCESS, BILATERAL FEMORAL ARTERIES (Bilateral: Groin)     Patient location during evaluation: Nursing Unit Anesthesia Type: General Level of consciousness: awake and alert Pain management: pain level controlled Vital Signs Assessment: post-procedure vital signs reviewed and stable Respiratory status: spontaneous breathing, nonlabored ventilation and respiratory function stable Cardiovascular status: blood pressure returned to baseline and stable Postop Assessment: no apparent nausea or vomiting Anesthetic complications: no   No notable events documented.  Last Vitals:  Vitals:   02/13/24 1500 02/13/24 1600  BP: (!) 102/50 100/75  Pulse: 71 70  Resp: 12 10  Temp:    SpO2: 98% 97%    Last Pain:  Vitals:   02/13/24 1218  TempSrc: Oral  PainSc:                  Collene Schlichter

## 2024-02-13 NOTE — Op Note (Signed)
 NAME: Sean Lowe    MRN: [id number] DOB: 1956/05/03    DATE OF OPERATION: 02/13/2024  PREOP DIAGNOSIS:    Asymptomatic 6 cm AAA  POSTOP DIAGNOSIS:    Same  PROCEDURE:    Ultrasound-guided micropuncture access of bilateral common femoral arteries in retrograde fashion Endovascular aortic repair 20 x 14 x 12,  Right common iliac 16 x 14 cm limb  Left common iliac 12 x 14 cm limb Left common iliac 12 x 7 cm limb extension  SURGEON: Victorino Sparrow  ASSIST: Mosetta Pigeon, PA  ANESTHESIA: General  EBL: 20 mL  INDICATIONS:    Sean Lowe is a 68 y.o. male with asymptomatic 6 cm infrarenal abdominal aneurysm.  After discussing risks and benefits of endovascular aortic repair, Sean Lowe elected to proceed.  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 After placement of IV's and arterial line general anesthesia was successfully induced. The patient was prepped and draped in the usual sterile fashion. Peri-operative antibiotics were given. A time-out was performed.   Bilateral percutaneous access of the CFA was obtained with ultrasound guidance. 2 perclose devices were placed in the pre-close technique and the arteriotomy was up-sized to a 8 french sheath over GWA wires. The patient was heparinized with 6000 units of IV heparin. A pigtale catheter was advanced into the aorta and an aortogram was obtained. This demonstrated widely patent renal arteries bilaterally and a long aortic neck. Bilateral hypogastric  arteries were patent. Length measurements from pre-op CT scan were confirmed. The right side sheath was up-sized to an 16 french dry seal sheath over a Glide Advantage wire. A  12cm GORE c3 main body was positioned in the infrarenal aorta. Another aortogram was performed under magnification to define the renal artery origins. The graft was deployed. The contralateral limb was selected with a vertebral catheter and soft glide wire. Once advanced into the graft and wire withdrawn the vertebral catheter was freely moving. An arteriogram was performed and documented the graft was in a good infrarenal location and that the right renal artery was patent. This was double confirmation that we had selected the contralateral limb. A 12 french sheath was then advanced into the contralateral limb over a Glide Advantage wire. A  12mm x 14cm limb was inserted on the left side and deployed per the IFU . The reminder of the main body was deployed, followed by 16 x 14 cm limb extension. A compliant balloon was used to dilate the main body proximal and distal seal zones as well as the contralateral overlap with the main body and the distal seal zone. A completion aortogram was obtained which revealed patent renal arteries and the left hypogastric arteries well as patent EVAR with no evidence of type I A, IB, or type Lowe endoleak.  The left-sided limb was short.  I was worried about future seal issues, and elected to extend this.  Therefore, a 12 x 7 cm limb extension was brought onto the field and extended to the left iliac bifurcation.  This was followed by a compliant balloon.  Follow-up angiography demonstrated an excellent result  with no endoleak.  This was felt to be an excellent result. The arteriotomies were closed over a wire using the previously placed perc-close devices with excellent hemostasis, prior to complete closure bilateral femoral arteriograms were obtained via a micropuncture sheath demonstrating  adequate access within the common femoral arteries with no access site complications.   Bilateral femoral angiogram showed patent femoral bifurcations without access complication. Posterior plaque was visualized. Pedal pulses were checked and found to be no different from pre-op.  An ultrasound ensured that there was no dissection, or flow-limiting stenosis in the right common femoral artery.  50 mg of protamine was given. The skin was closed with 4-0 monocryl suture. Dry sterile dressing was placed. General anesthesia was successfully terminated. The patient was transported to the recovery room in stable position   Victorino Sparrow, MD [facility] DATE OF DICTATION:   02/13/2024

## 2024-02-13 NOTE — Anesthesia Procedure Notes (Signed)
 Arterial Line Insertion Start/End3/17/2025 7:00 AM, 02/13/2024 7:05 AM Performed by: Darryl Nestle, CRNA, CRNA  Patient location: Pre-op. Preanesthetic checklist: patient identified, IV checked, site marked, risks and benefits discussed, surgical consent, monitors and equipment checked, pre-op evaluation, timeout performed and anesthesia consent Lidocaine 1% used for infiltration Left, radial was placed Catheter size: 20 G Hand hygiene performed  and maximum sterile barriers used   Attempts: 1 Procedure performed without using ultrasound guided technique. Following insertion, dressing applied and Biopatch. Post procedure assessment: normal and unchanged  Patient tolerated the procedure well with no immediate complications.

## 2024-02-13 NOTE — Transfer of Care (Signed)
 Immediate Anesthesia Transfer of Care Note  Patient: Sean Lowe  Procedure(s) Performed: INSERTION, ENDOVASCULAR STENT GRAFT, AORTA, ABDOMINAL ULTRASOUND GUIDANCE, FOR VASCULAR ACCESS, BILATERAL FEMORAL ARTERIES (Bilateral: Groin)  Patient Location: PACU  Anesthesia Type:General  Level of Consciousness: awake, alert , and oriented  Airway & Oxygen Therapy: Patient Spontanous Breathing  Post-op Assessment: Report given to RN and Post -op Vital signs reviewed and stable  Post vital signs: Reviewed and stable  Last Vitals:  Vitals Value Taken Time  BP 90/59 02/13/24 1000  Temp 98   Pulse 59 02/13/24 1000  Resp 11 02/13/24 0959  SpO2 95 % 02/13/24 1000  Vitals shown include unfiled device data.  Last Pain:  Vitals:   02/13/24 0702  TempSrc:   PainSc: 0-No pain         Complications: No notable events documented.

## 2024-02-13 NOTE — Care Management Important Message (Signed)
 Important Message  Patient Details  Name: Sean Lowe MRN: [id number] Date of Birth: 1955-12-21   Important Message Given:  Yes - Medicare IM     [name] 02/13/2024, 12:25 PM

## 2024-02-13 NOTE — Anesthesia Procedure Notes (Signed)
 Procedure Name: Intubation Date/Time: 02/13/2024 7:47 AM  Performed by: Darryl Nestle, CRNAPre-anesthesia Checklist: Patient identified, Emergency Drugs available, Suction available and Patient being monitored Patient Re-evaluated:Patient Re-evaluated prior to induction Oxygen Delivery Method: Circle system utilized Preoxygenation: Pre-oxygenation with 100% oxygen Induction Type: IV induction Ventilation: Mask ventilation without difficulty Laryngoscope Size: Mac and 3 Grade View: Grade I Tube type: Oral Tube size: 7.0 mm Number of attempts: 1 Airway Equipment and Method: Stylet and Oral airway Placement Confirmation: ETT inserted through vocal cords under direct vision, positive ETCO2 and breath sounds checked- equal and bilateral Secured at: 23 cm Tube secured with: Tape Dental Injury: Teeth and Oropharynx as per pre-operative assessment

## 2024-02-13 NOTE — H&P (Signed)
 Office Note   Patient seen and examined in preop holding.  No complaints. No changes to medication history or physical exam since last seen in clinic. After discussing the risks and benefits of EVAR for 6cm asymptomatic AAA, Sean Lowe elected to proceed.   Victorino Sparrow MD  HPI: Sean Lowe is a 68 y.o. (1956-05-05) male presenting to discuss recent CTA of 86 m infrarenal abdominal aortic aneurysm  On exam today, Sean Lowe was doing well, accompanied by his caregiver.  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 This diagnosis was made after a breakdown in college.  He has been able to hold jobs, but now is unemployed.  Sean Lowe denies family history of AAA.  Denies claudication, ischemic rest pain, tissue loss. He spends majority of his time watching TV, he enjoys the [employer] channel. He stated he has no family for me to discuss his diagnosis with. Denies recent abdominal pain, back pain, chest pain  The pt is  on a statin for cholesterol management.  The pt is  on a daily aspirin.   Other AC:  - The pt is  on medication for hypertension.   The pt is not diabetic.  Tobacco hx:  -  Past Medical History:  Diagnosis Date   AAA (abdominal aortic aneurysm) (HCC)    Anxiety    COVID    2023 2024   Dyspnea    Headache    Orthostatic hypotension    Psychiatric diagnosis    Schizophrenia (HCC)     Past Surgical History:  Procedure Laterality Date   FINGER SURGERY Left    repair from injury    Social History   Socioeconomic History   Marital status: Single    Spouse name: Not on file   Number of children: Not on file   Years of education: Not on file   Highest education level: Not on file  Occupational History   Not on file  Tobacco Use   Smoking status: Every Day    Current packs/day: 1.50    Average packs/day: 1.5 packs/day for 42.0 years (63.0 ttl pk-yrs)    Types: Cigarettes   Smokeless tobacco: Never  Vaping Use    Vaping status: Never Used  Substance and Sexual Activity   Alcohol use: Not Currently    Comment: occasional   Drug use: No   Sexual activity: Never    Birth control/protection: Abstinence  Other Topics Concern   Not on file  Social History Narrative   Not on file   Social Drivers of Health   Financial Resource Strain: Not on file  Food Insecurity: Not on file  Transportation Needs: Not on file  Physical Activity: Not on file  Stress: Not on file  Social Connections: Not on file  Intimate Partner Violence: Not on file   Family History  Problem Relation Age of Onset   Stroke Father    Aneurysm Brother    Stroke Brother     Current Facility-Administered Medications  Medication Dose Route Frequency Provider Last Rate Last Admin   0.9 %  sodium chloride infusion   Intravenous Continuous Victorino Sparrow, MD       ceFAZolin (ANCEF) 2-4 GM/100ML-% IVPB            ceFAZolin (ANCEF) IVPB 2g/100 mL premix  2 g Intravenous 30 min Pre-Op Victorino Sparrow, MD       Chlorhexidine Gluconate Cloth 2 % PADS 6 each  6 each Topical Once Victorino Sparrow, MD       And   Chlorhexidine Gluconate Cloth 2 % PADS 6 each  6 each Topical Once Victorino Sparrow, MD       lactated ringers infusion   Intravenous Continuous Collene Schlichter, MD        No Known Allergies   REVIEW OF SYSTEMS:  [X]  denotes positive finding, [ ]  denotes negative finding Cardiac  Comments:  Chest pain or chest pressure:    Shortness of breath upon exertion:    Short of breath when lying flat:    Irregular heart rhythm:        Vascular    Pain in calf, thigh, or hip brought on by ambulation:    Pain in feet at night that wakes you up from your sleep:     Blood clot in your veins:    Leg swelling:         Pulmonary    Oxygen at home:    Productive cough:     Wheezing:         Neurologic    Sudden weakness in arms or legs:     Sudden numbness in arms or legs:     Sudden onset of difficulty speaking or  slurred speech:    Temporary loss of vision in one eye:     Problems with dizziness:         Gastrointestinal    Blood in stool:     Vomited blood:         Genitourinary    Burning when urinating:     Blood in urine:        Psychiatric    Major depression:         Hematologic    Bleeding problems:    Problems with blood clotting too easily:        Skin    Rashes or ulcers:        Constitutional    Fever or chills:      PHYSICAL EXAMINATION:  Vitals:   02/13/24 0557  BP: 131/77  Pulse: 73  Resp: 18  Temp: 97.7 F (36.5 C)  TempSrc: Oral  SpO2: 99%  Weight: 63.5 kg  Height: 5\' 10"  (1.778 m)    General:  WDWN in NAD; vital signs documented above Gait: Not observed HENT: WNL, normocephalic Pulmonary: normal non-labored breathing , without wheezing Cardiac: regular HR Abdomen: soft, NT, no masses Skin: without rashes Vascular Exam/Pulses:  Right Left  Radial 2+ (normal) 2+ (normal)  Ulnar    Femoral    Popliteal    DP 2+ (normal) 2+ (normal)  PT 2+ 2+   Extremities: without ischemic changes, without Gangrene , without cellulitis; without open wounds;  Musculoskeletal: no muscle wasting or atrophy  Neurologic: A&O X 3;  No focal weakness or paresthesias are detected Psychiatric:  The pt has Normal affect.   Non-Invasive Vascular Imaging:    Abdominal Aorta Findings:  +-----------+-------+----------+----------+--------+--------+--------+  Location  AP (cm)Trans (cm)PSV (cm/s)WaveformThrombusComments  +-----------+-------+----------+----------+--------+--------+--------+  Proximal  2.30   2.38                                          +-----------+-------+----------+----------+--------+--------+--------+  Mid       3.43   3.28      169                                 +-----------+-------+----------+----------+--------+--------+--------+  Distal    5.88   6.00      110               Present            +-----------+-------+----------+----------+--------+--------+--------+  RT CIA Prox1.0    0.9       148                                 +-----------+-------+----------+----------+--------+--------+--------+  LT CIA Prox0.7    0.8       108                                 +-----------+-------+----------+----------+--------+--------+--------+    Aorta: Fusiform infrarenal abdominal aortic aneurysm measuring up to 60 mm in maximum short axis dimension, previously 52 mm. The suprarenal abdominal aorta is normal in caliber and patent. Scattered atherosclerotic calcifications.    ASSESSMENT/PLAN: Sean Lowe is a 68 y.o. male presenting with asymptomatic 6 cm AAA.  Society for vascular surgery guidelines recommend repair.  I had a long discussion with Sean Lowe and his caregiver regarding the above.  We discussed the risks and benefits of surgery. Iliac arteries are on the smaller side, therefore access is my major concern.  I think that the Gore endoprosthesis is a good choice, due to the dry seal sheaths that are used. After discussing the risks and benefits of endovascular aortic repair, Sean Lowe elected to proceed.  I plan to reach out to his sister to discuss the surgery further.  Sean Lowe will need a cardiology referral for preoperative risk assessment and optimization.  The surgery will be scheduled after.  We discussed the signs and symptoms of rupture.  I asked him to call [phone] immediately should any of the symptoms occur.    Victorino Sparrow, MD [facility] [phone]

## 2024-02-13 NOTE — Progress Notes (Signed)
 Looks good postop Palpable posterior tibial artery pulses Groin soft No abdominal pain

## 2024-02-14 ENCOUNTER — Other Ambulatory Visit: Payer: Self-pay

## 2024-02-14 ENCOUNTER — Encounter (HOSPITAL_COMMUNITY): Payer: Self-pay | Admitting: Vascular Surgery

## 2024-02-14 LAB — BASIC METABOLIC PANEL
Anion gap: 8 (ref 5–15)
BUN: 17 mg/dL (ref 8–23)
CO2: 24 mmol/L (ref 22–32)
Calcium: 8.7 mg/dL — ABNORMAL LOW (ref 8.9–10.3)
Chloride: 105 mmol/L (ref 98–111)
Creatinine, Ser: 1.17 mg/dL (ref 0.61–1.24)
GFR, Estimated: >60 mL/min (ref >60)
Glucose, Bld: 145 mg/dL — ABNORMAL HIGH (ref 70–99)
Potassium: 3.8 mmol/L (ref 3.5–5.1)
Sodium: 137 mmol/L (ref 135–145)

## 2024-02-14 LAB — CBC
HCT: 28.3 % — ABNORMAL LOW (ref 39.0–52.0)
Hemoglobin: 9.5 g/dL — ABNORMAL LOW (ref 13.0–17.0)
MCH: 31.6 pg (ref 26.0–34.0)
MCHC: 33.6 g/dL (ref 30.0–36.0)
MCV: 94 fL (ref 80.0–100.0)
Platelets: 128 10*3/uL — ABNORMAL LOW (ref 150–400)
RBC: 3.01 MIL/uL — ABNORMAL LOW (ref 4.22–5.81)
RDW: 12.4 % (ref 11.5–15.5)
WBC: 13.6 10*3/uL — ABNORMAL HIGH (ref 4.0–10.5)
nRBC: 0 % (ref 0.0–0.2)

## 2024-02-14 NOTE — Progress Notes (Addendum)
  Progress Note    02/14/2024 8:18 AM 1 Day Post-Op  Subjective: No complaints    Vitals:   02/14/24 0500 02/14/24 0736  BP: (!) 113/57 108/79  Pulse: (!) 58 69  Resp: 16 16  Temp:  98 F (36.7 C)  SpO2: 97% 99%    Physical Exam: General: Resting comfortably in bed Cardiac: Regular Lungs: Nonlabored Incisions: Bilateral groin cath sites intact and soft without hematoma Extremities: Bilateral lower extremities well-perfused with palpable PT pulses Abdomen: Soft, nontender, nondistended  CBC    Component Value Date/Time   WBC 13.6 (H) 02/14/2024 0444   RBC 3.01 (L) 02/14/2024 0444   HGB 9.5 (L) 02/14/2024 0444   HCT 28.3 (L) 02/14/2024 0444   PLT 128 (L) 02/14/2024 0444   MCV 94.0 02/14/2024 0444   MCH 31.6 02/14/2024 0444   MCHC 33.6 02/14/2024 0444   RDW 12.4 02/14/2024 0444   LYMPHSABS 1.6 12/15/2022 1132   MONOABS 0.5 12/15/2022 1132   EOSABS 0.2 12/15/2022 1132   BASOSABS 0.1 12/15/2022 1132    BMET    Component Value Date/Time   NA 137 02/14/2024 0444   K 3.8 02/14/2024 0444   CL 105 02/14/2024 0444   CO2 24 02/14/2024 0444   GLUCOSE 145 (H) 02/14/2024 0444   BUN 17 02/14/2024 0444   CREATININE 1.17 02/14/2024 0444   CREATININE 1.20 05/25/2023 0000   CALCIUM 8.7 (L) 02/14/2024 0444   GFRNONAA >60 02/14/2024 0444   GFRNONAA 60 03/24/2021 0000   GFRAA  02/13/2024 0635    PER BLOOD BANK LABEL IDENTIFICATION ERROR. DISCARD THE RESULT   GFRAA 69 03/24/2021 0000    INR    Component Value Date/Time   INR 1.3 (H) 02/13/2024 0949     Intake/Output Summary (Last 24 hours) at 02/14/2024 0818 Last data filed at 02/14/2024 8657 Gross per 24 hour  Intake 2580 ml  Output 3010 ml  Net -430 ml      Assessment/Plan:  68 y.o. male is 1 day post op, s/p: EVAR with left common iliac limb extension   -Patient is doing well this morning.  He denies any pain -Bilateral groin sites soft, dry, and intact without hematoma or bruising -Bilateral lower  extremities warm and well-perfused with palpable PT pulses -Hemoglobin with slight drop from 11.4 to 9.5.  No signs of further blood loss or indications for transfusion -Creatinine stable at 1.17.  Great urinary output in the past 24 hours -He has not mobilized yet.  Will plan to mobilize with RN assist prior to discharge.  Anticipate discharge today back to group home after mobilization.  Will plan for 4-week follow-up with Dr. Karin Lieu with CTA abdomen/pelvis   Loel Dubonnet, PA-C [facility] [phone] 02/14/2024 8:18 AM  VASCULAR STAFF ADDENDUM: I have independently interviewed and examined the patient. I agree with the above.    Victorino Sparrow MD [facility] Phone Number: [phone] 02/14/2024 1:18 PM

## 2024-02-14 NOTE — Discharge Summary (Signed)
 EVAR Discharge Summary   Sean Lowe 1956-09-13 68 y.o. male  MRN: [id number]  Admission Date: 02/13/2024  Discharge Date: 02/14/2024  Physician: Victorino Sparrow, MD  Admission Diagnosis: Abdominal aortic aneurysm (AAA) without rupture, unspecified part (HCC) [I71.40] AAA (abdominal aortic aneurysm) (HCC) [I71.40]  Discharge Day Diagnosis: Abdominal aortic aneurysm (AAA) without rupture, unspecified part (HCC) [I71.40] AAA (abdominal aortic aneurysm) [facility]) [I71.40]  Hospital Course:  The patient was admitted to the hospital and taken to the operating room on 02/13/2024 and underwent: Endovascular repair of abdominal aortic aneurysm with left common iliac limb extension.  He tolerated the procedure well and was transferred to the PACU in stable condition  On POD1 the patient was doing well without any pain.  His lower extremities were warm and well-perfused with palpable PT pulses.  His groin access sites were clean and intact without hematoma.   He was tolerating a normal diet and mobilizing without issue.  He was also urinating without issue.   He was stable for discharge on POD 1 back to his group home.  CBC    Component Value Date/Time   WBC 13.6 (H) 02/14/2024 0444   RBC 3.01 (L) 02/14/2024 0444   HGB 9.5 (L) 02/14/2024 0444   HCT 28.3 (L) 02/14/2024 0444   PLT 128 (L) 02/14/2024 0444   MCV 94.0 02/14/2024 0444   MCH 31.6 02/14/2024 0444   MCHC 33.6 02/14/2024 0444   RDW 12.4 02/14/2024 0444   LYMPHSABS 1.6 12/15/2022 1132   MONOABS 0.5 12/15/2022 1132   EOSABS 0.2 12/15/2022 1132   BASOSABS 0.1 12/15/2022 1132    BMET    Component Value Date/Time   NA 137 02/14/2024 0444   K 3.8 02/14/2024 0444   CL 105 02/14/2024 0444   CO2 24 02/14/2024 0444   GLUCOSE 145 (H) 02/14/2024 0444   BUN 17 02/14/2024 0444   CREATININE 1.17 02/14/2024 0444   CREATININE 1.20 05/25/2023 0000   CALCIUM 8.7 (L) 02/14/2024 0444   GFRNONAA >60 02/14/2024 0444    GFRNONAA 60 03/24/2021 0000   GFRAA  02/13/2024 0635    PER BLOOD BANK LABEL IDENTIFICATION ERROR. DISCARD THE RESULT   GFRAA 69 03/24/2021 0000       Discharge Instructions     Call MD for:  redness, tenderness, or signs of infection (pain, swelling, redness, odor or green/yellow discharge around incision site)   Complete by: As directed    Call MD for:  severe uncontrolled pain   Complete by: As directed    Call MD for:  temperature >100.4   Complete by: As directed    Diet - low sodium heart healthy   Complete by: As directed    Increase activity slowly   Complete by: As directed    Remove dressing in 48 hours   Complete by: As directed        Discharge Diagnosis:  Abdominal aortic aneurysm (AAA) without rupture, unspecified part (HCC) [I71.40] AAA (abdominal aortic aneurysm) (HCC) [I71.40]  Secondary Diagnosis: Patient Active Problem List   Diagnosis Date Noted   AAA (abdominal aortic aneurysm) (HCC) 02/13/2024   AAA (abdominal aortic aneurysm) without rupture (HCC) 12/25/2015   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 Reported on 12/25/2015   Aspirin Low Dose 81 MG tablet Generic drug: aspirin EC TAKE (1) TABLET BY MOUTH ONCE DAILY.   fludrocortisone 0.1 MG tablet Commonly known as: FLORINEF Take 1 tablet (0.1 mg total) by mouth every evening.   fluPHENAZine 5 MG tablet Commonly known as: PROLIXIN Take 15 mg by mouth at bedtime.   Norel AD 4-10-325 MG  Tabs Generic drug: Chlorphen-PE-Acetaminophen Take 1 tablet by mouth 2 (two) times daily as needed (Cough and congestion).   rosuvastatin 10 MG tablet Commonly known as: CRESTOR TAKE (1) TABLET BY MOUTH ONCE DAILY.        Discharge Instructions:   [facility]  Discharge Instructions Endovascular Aortic Aneurysm Repair  Please refer to the following instructions for your post-procedure care. Your surgeon or Physician Assistant will discuss any changes with you.  Activity  You are encouraged to walk as much as you can. You can slowly return to normal activities but must avoid strenuous activity and heavy lifting until your doctor tells you it's OK. Avoid activities such as vacuuming or swinging a gold club. It is normal to feel tired for several weeks after your surgery. Do not drive until your doctor gives the OK and you are no longer taking prescription pain medications. It is also normal to have difficulty with sleep habits, eating, and bowel movements after surgery. These will go away with time.  Bathing/Showering  You may shower after you go home. If you have an incision, do not soak in a bathtub, hot tub, or swim until the incision heals completely.  Incision Care  Shower every day. Clean your incision with mild soap and water. Pat the area dry with a clean towel. You do not need a bandage unless otherwise instructed. Do not apply any ointments or creams to your incision. If you clothing is irritating, you may cover your incision with a dry gauze pad.  Diet  Resume your normal diet. There are no special food restrictions following this procedure. A low fat/low cholesterol diet is recommended for all patients with vascular disease. In order to heal from your surgery, it is CRITICAL to get adequate nutrition. Your body requires vitamins, minerals, and protein. Vegetables are the best source of vitamins and minerals. Vegetables also provide the  perfect balance of protein. Processed food has little nutritional value, so try to avoid this.  Medications  Resume taking all of your medications unless your doctor or Physician Assistant tells you not to. If your incision is causing pain, you may take over-the-counter pain relievers such as acetaminophen (Tylenol). If you were prescribed a stronger pain medication, please be aware these medications can cause nausea and constipation. Prevent nausea by taking the medication with a snack or meal. Avoid constipation by drinking plenty of fluids and eating foods with a high amount of fiber, such as fruits, vegetables, and grains. Do not take Tylenol if you are taking prescription pain medications.   Follow up  Our office will schedule a follow-up appointment with a C.T. scan 3-4 weeks after your surgery.  Please call us immediately for any of the  following conditions  Severe or worsening pain in your legs or feet or in your abdomen back or chest. Increased pain, redness, drainage (pus) from your incision sit. Increased abdominal pain, bloating, nausea, vomiting or persistent diarrhea. Fever of 101 degrees or higher. Swelling in your leg (s),  Reduce your risk of vascular disease  Stop smoking. If you would like help call [facility] at 1-800-QUIT-NOW ((719)768-5774) or [place] at [phone]. Manage your cholesterol Maintain a desired weight Control your diabetes Keep your blood pressure down  If you have questions, please call the office at [phone].   Disposition: Long Term Care Facility/Group Home  Patient's condition: is Excellent  Follow up: 1. Dr. Karin Lieu in 4 weeks with CTA protocol   Loel Dubonnet, PA-C [facility] [phone] 02/14/2024  10:50 AM   - For VQI Registry use - Post-op:  Time to Extubation: [x]  In OR, [ ]  < 12 hrs, [ ]  12-24 hrs, [ ]  >=24 hrs Vasopressors Req. Post-op: No MI: No., [ ]  Troponin only, [ ]  EKG or Clinical New  Arrhythmia: No CHF: No ICU Stay: 0 days Transfusion: No     If yes,  units given  Complications: Resp failure: No., [ ]  Pneumonia, [ ]  Ventilator Chg in renal function: No., [ ]  Inc. Cr > 0.5, [ ]  Temp. Dialysis,  [ ]  Permanent dialysis Leg ischemia: No., no Surgery needed, [ ]  Yes, Surgery needed,  [ ]  Amputation Bowel ischemia: No., [ ]  Medical Rx, [ ]  Surgical Rx Wound complication: No., [ ]  Superficial separation/infection, [ ]  Return to OR Return to OR: No  Return to OR for bleeding: No Stroke: No., [ ]  Minor, [ ]  Major  Discharge medications: Statin use:  Yes  ASA use:  Yes  Plavix use:  No  Beta blocker use:  No  ARB use:  No ACEI use:  No CCB use:  No

## 2024-02-14 NOTE — [place] FL2 (Addendum)
 [place] MEDICAID FL2 LEVEL OF CARE FORM     IDENTIFICATION  Patient Name: Arend Bahl III Birthdate: [birth year] Sex: male Admission Date (Current Location): 02/13/2024  [facility] and [place] Number:  Producer, television/film/video and Address:  [facility] [place], 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    Orientation RESPIRATION BLADDER Height & Weight     Self, Time, Situation, Place  Normal Continent Weight: 140 lb (63.5 kg) Height:  5\' 10"  (177.8 cm)  BEHAVIORAL SYMPTOMS/MOOD NEUROLOGICAL BOWEL NUTRITION STATUS      Continent Diet (regular diet)  AMBULATORY STATUS COMMUNICATION OF NEEDS Skin   Limited Assist Verbally Surgical wounds (closed incison left and right groin)                       Personal Care Assistance Level of Assistance  Bathing, Feeding, Dressing Bathing Assistance: Limited assistance Feeding assistance: Independent Dressing Assistance: Limited assistance     Functional Limitations Info  Sight, Hearing, Speech Sight Info: Adequate Hearing Info: Adequate Speech Info: Adequate    SPECIAL CARE FACTORS FREQUENCY                       Contractures Contractures Info: Not present    Additional Factors Info  Code Status, Allergies Code Status Info: FULL Allergies Info: NKA           Current Medications (02/14/2024):  This is the current hospital active medication list Current  Facility-Administered Medications  Medication Dose Route Frequency Provider Last Rate Last Admin   0.9 %  sodium chloride infusion  500 mL Intravenous Once PRN Clinton Gallant M, PA-C       0.9 %  sodium chloride infusion   Intravenous Continuous Clinton Gallant M, PA-C   Stopped at 02/14/24 0715   acetaminophen (TYLENOL) tablet 325-650 mg  325-650 mg Oral Q4H PRN Lars Mage, PA-C   650 mg at 02/14/24 5643   Or   acetaminophen (TYLENOL) suppository 325-650 mg  325-650 mg Rectal Q4H PRN Lars Mage, PA-C       alum & mag hydroxide-simeth (MAALOX/MYLANTA) 200-200-20 MG/5ML suspension 15-30 mL  15-30 mL Oral Q2H PRN Clinton Gallant M, PA-C       aspirin EC tablet 81 mg  81 mg Oral Daily Clinton Gallant M, PA-C   81 mg at 02/14/24 3295   docusate sodium (COLACE) capsule 100 mg  100 mg Oral Daily Clinton Gallant M, PA-C   100 mg at 02/14/24 1884   fludrocortisone (FLORINEF) tablet 0.1 mg  0.1 mg Oral QPM Clinton Gallant M, PA-C   0.1 mg at 02/13/24 2053   fluPHENAZine (PROLIXIN) tablet 15 mg  15 mg Oral QHS Clinton Gallant M, PA-C   15 mg at 02/13/24 2100   guaiFENesin-dextromethorphan (ROBITUSSIN DM) 100-10 MG/5ML syrup 15 mL  15 mL Oral Q4H PRN Lars Mage,  PA-C       heparin injection 5,000 Units  5,000 Units Subcutaneous Q8H Lars Mage, [place]   5,000 Units at 02/14/24 2440   hydrALAZINE (APRESOLINE) injection 5 mg  5 mg Intravenous Q20 Min PRN Clinton Gallant M, PA-C       HYDROmorphone (DILAUDID) injection 0.5-1 mg  0.5-1 mg Intravenous Q2H PRN Clinton Gallant M, PA-C       labetalol (NORMODYNE) injection 10 mg  10 mg Intravenous Q10 min PRN Clinton Gallant M, PA-C       magnesium sulfate IVPB 2 g 50 mL  2 g Intravenous Daily PRN Clinton Gallant M, PA-C       metoprolol tartrate (LOPRESSOR) injection 2-5 mg  2-5 mg Intravenous Q2H PRN Clinton Gallant M, PA-C       ondansetron [facility]) injection 4 mg  4 mg Intravenous Q6H PRN Clinton Gallant M, PA-C       oxyCODONE-acetaminophen (PERCOCET/ROXICET) 5-325 MG  per tablet 1-2 tablet  1-2 tablet Oral Q4H PRN Clinton Gallant M, PA-C       pantoprazole (PROTONIX) EC tablet 40 mg  40 mg Oral Daily Clinton Gallant M, PA-C   40 mg at 02/14/24 1027   phenol (CHLORASEPTIC) mouth spray 1 spray  1 spray Mouth/Throat PRN Clinton Gallant M, PA-C       potassium chloride SA (KLOR-CON M) CR tablet 20-40 mEq  20-40 mEq Oral Daily PRN Clinton Gallant M, PA-C       rosuvastatin (CRESTOR) tablet 10 mg  10 mg Oral QHS Clinton Gallant M, PA-C   10 mg at 02/13/24 2100   senna-docusate (Senokot-S) tablet 1 tablet  1 tablet Oral QHS PRN Lars Mage, PA-C         Discharge Medications: acetaminophen 325 MG tablet Commonly known as: TYLENOL Take 650 mg by mouth every 4 (four) hours as needed for moderate pain (pain score 4-6) or mild pain (pain score 1-3). Reported on 12/25/2015    Aspirin Low Dose 81 MG tablet Generic drug: aspirin EC TAKE (1) TABLET BY MOUTH ONCE DAILY.    fludrocortisone 0.1 MG tablet Commonly known as: FLORINEF Take 1 tablet (0.1 mg total) by mouth every evening.    fluPHENAZine 5 MG tablet Commonly known as: PROLIXIN Take 15 mg by mouth at bedtime.    Norel AD 4-10-325 MG Tabs Generic drug: Chlorphen-PE-Acetaminophen Take 1 tablet by mouth 2 (two) times daily as needed (Cough and congestion).    rosuvastatin 10 MG tablet Commonly known as: CRESTOR TAKE (1) TABLET BY MOUTH ONCE DAILY.             Discharge Instructions:    [facility]  Discharge Instructions Endovascular Aortic Aneurysm Repair   Please refer to the following instructions for your post-procedure care. Your surgeon or Physician Assistant will discuss any changes with you.   Activity  Relevant Imaging Results:  Relevant Lab Results:   Additional Information SSN [id number]  Eduard Roux, LCSW

## 2024-02-14 NOTE — TOC Transition Note (Signed)
 Transition of Care [facility]) - Discharge Note   Patient Details  Name: Sean Lowe MRN: [id number] Date of Birth: [birth year]  [facility]) CM/SW Contact:  Eduard Roux, LCSW Phone Number: 02/14/2024, 2:09 PM   Clinical Narrative:     Patient will Discharge WU:[id number] Adult Group Home  Discharge Date:02/14/2024 Family Notified:  Transport By: Group Home will provide transport   Per MD patient is ready for discharge. RN, patient, and facility notified of discharge. Discharge Summary and FL2 sent to facility. RN given number for report [phone], Ms Caryn Section, the manager of the Group Home.  Clinical Social Worker signing off.  Antony Blackbird, MSW, LCSW Clinical Social Worker     Final next level of care: Group Home Barriers to Discharge: Barriers Resolved   Patient Goals and CMS Choice            Discharge Placement                       Discharge Plan and Services Additional resources added to the After Visit Summary for   In-house Referral: Clinical Social Work                                   Social Drivers of Health (SDOH) Interventions SDOH Screenings   Food Insecurity: No Food Insecurity (02/14/2024)  Housing: Low Risk  (02/14/2024)  Transportation Needs: No Transportation Needs (02/14/2024)  Utilities: Not At Risk (02/14/2024)  Social Connections: Socially Isolated (02/14/2024)  Tobacco Use: High Risk (02/10/2024)     Readmission Risk Interventions     No data to display

## 2024-02-14 NOTE — Discharge Instructions (Signed)

## 2024-02-14 NOTE — Progress Notes (Signed)
 Pt discharged with group home representative. D/C paperwork sent with pt and representative aware that all meds for 3/18 had been given (see MAR)-due to to group home not being able to obtain medication until 3/19. Sedric Guia S Braedon Sjogren

## 2024-02-14 NOTE — Plan of Care (Signed)
  Problem: Education: Goal: Knowledge of General Education information will improve Description: Including pain rating scale, medication(s)/side effects and non-pharmacologic comfort measures Outcome: Adequate for Discharge   Problem: Health Behavior/Discharge Planning: Goal: Ability to manage health-related needs will improve Outcome: Adequate for Discharge   Problem: Clinical Measurements: Goal: Ability to maintain clinical measurements within normal limits will improve Outcome: Adequate for Discharge Goal: Will remain free from infection Outcome: Adequate for Discharge Goal: Diagnostic test results will improve Outcome: Adequate for Discharge Goal: Respiratory complications will improve Outcome: Adequate for Discharge Goal: Cardiovascular complication will be avoided Outcome: Adequate for Discharge   Problem: Activity: Goal: Risk for activity intolerance will decrease Outcome: Adequate for Discharge   Problem: Nutrition: Goal: Adequate nutrition will be maintained Outcome: Adequate for Discharge   Problem: Coping: Goal: Level of anxiety will decrease Outcome: Adequate for Discharge   Problem: Elimination: Goal: Will not experience complications related to bowel motility Outcome: Adequate for Discharge Goal: Will not experience complications related to urinary retention Outcome: Adequate for Discharge   Problem: Pain Managment: Goal: General experience of comfort will improve and/or be controlled Outcome: Adequate for Discharge   Problem: Safety: Goal: Ability to remain free from injury will improve Outcome: Adequate for Discharge   Problem: Skin Integrity: Goal: Risk for impaired skin integrity will decrease Outcome: Adequate for Discharge   Problem: Education: Goal: Knowledge of discharge needs will improve Outcome: Adequate for Discharge   Problem: Clinical Measurements: Goal: Postoperative complications will be avoided or minimized Outcome: Adequate for  Discharge   Problem: Respiratory: Goal: Will achieve and/or maintain a regular respiratory rate, without signs or symptoms of dyspnea Outcome: Adequate for Discharge   Problem: Skin Integrity: Goal: Demonstration of wound healing without infection will improve Outcome: Adequate for Discharge

## 2024-02-29 ENCOUNTER — Other Ambulatory Visit: Payer: Self-pay

## 2024-02-29 DIAGNOSIS — I714 Abdominal aortic aneurysm, without rupture, unspecified: Secondary | ICD-10-CM

## 2024-03-14 ENCOUNTER — Other Ambulatory Visit

## 2024-03-26 NOTE — Progress Notes (Signed)
 Office Note     HPI: Sean Lowe is a 68 y.o. (1956/11/29) male presenting in follow-up status post EVAR for asymptomatic, 6 cm infrarenal abdominal aortic aneurysm.  On exam today, Sean Lowe was doing well, accompanied by his caregiver.  He had no complaints.  Denies abdominal pain, chest pain, back pain.  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 This diagnosis was made after a breakdown in college.  He has been able to hold jobs, but now is unemployed.  Sean Lowe denies family history of AAA.  Denies claudication, ischemic rest pain, tissue loss. He spends majority of his time watching TV, he enjoys the Me TV channel. Denies recent abdominal pain, back pain, chest pain  The pt is  on a statin for cholesterol management.  The pt is  on a daily aspirin .   Other AC:  - The pt is  on medication for hypertension.   The pt is not diabetic.  Tobacco hx:  -  Past Medical History:  Diagnosis Date   AAA (abdominal aortic aneurysm) (HCC)    Anxiety    COVID    2023 2024   Dyspnea    Headache    Orthostatic hypotension    Psychiatric diagnosis    Schizophrenia (HCC)     Past Surgical History:  Procedure Laterality Date   ABDOMINAL AORTIC ENDOVASCULAR STENT GRAFT N/A 02/13/2024   Procedure: INSERTION, ENDOVASCULAR STENT GRAFT, AORTA, ABDOMINAL;  Surgeon: Kayla Part, MD;  Location: [facility] OR;  Service: Vascular;  Laterality: N/A;   FINGER SURGERY Left    repair from injury   ULTRASOUND GUIDANCE FOR VASCULAR ACCESS Bilateral 02/13/2024   Procedure: ULTRASOUND GUIDANCE, FOR VASCULAR ACCESS, BILATERAL FEMORAL ARTERIES;  Surgeon: Kayla Part, MD;  Location: [facility] OR;  Service: Vascular;  Laterality: Bilateral;    Social History   Socioeconomic History   Marital status: Single    Spouse name: Not on file   Number of children: Not on file   Years of education: Not on file   Highest education level: Not on file  Occupational History   Not on file   Tobacco Use   Smoking status: Every Day    Current packs/day: 1.50    Average packs/day: 1.5 packs/day for 42.0 years (63.0 ttl pk-yrs)    Types: Cigarettes   Smokeless tobacco: Never  Vaping Use   Vaping status: Never Used  Substance and Sexual Activity   Alcohol use: Not Currently    Comment: occasional   Drug use: No   Sexual activity: Never    Birth control/protection: Abstinence  Other Topics Concern   Not on file  Social History Narrative   Not on file   Social Drivers of Health   Financial Resource Strain: Not on file  Food Insecurity: No Food Insecurity (02/14/2024)   Hunger Vital Sign    Worried About Running Out of Food in the Last Year: Never true    Ran Out of Food in the Last Year: Never true  Transportation Needs: No Transportation Needs (02/14/2024)   PRAPARE - Administrator, Civil Service (Medical): No    Lack of Transportation (Non-Medical): No  Physical Activity: Not on file  Stress: Not on file  Social Connections: Socially Isolated (02/14/2024)   Social Connection and Isolation Panel [NHANES]    Frequency of Communication with Friends and Family: Twice a week    Frequency of Social Gatherings with Friends and Family: Never    Attends  Religious Services: Never    Active Member of Clubs or Organizations: No    Attends Banker Meetings: Never    Marital Status: Never married  Intimate Partner Violence: Not At Risk (02/14/2024)   Humiliation, Afraid, Rape, and Kick questionnaire    Fear of Current or Ex-Partner: No    Emotionally Abused: No    Physically Abused: No    Sexually Abused: No   Family History  Problem Relation Age of Onset   Stroke Father    Aneurysm Brother    Stroke Brother     Current Outpatient Medications  Medication Sig Dispense Refill   acetaminophen  (TYLENOL ) 325 MG tablet Take 650 mg by mouth every 4 (four) hours as needed for moderate pain (pain score 4-6) or mild pain (pain score 1-3). Reported on  12/25/2015     ASPIRIN  LOW DOSE 81 MG EC tablet TAKE (1) TABLET BY MOUTH ONCE DAILY. 30 tablet 11   fludrocortisone  (FLORINEF ) 0.1 MG tablet Take 1 tablet (0.1 mg total) by mouth every evening. 90 tablet 1   fluPHENAZine  (PROLIXIN ) 5 MG tablet Take 15 mg by mouth at bedtime.     NOREL AD 4-10-325 MG TABS Take 1 tablet by mouth 2 (two) times daily as needed (Cough and congestion).     rosuvastatin  (CRESTOR ) 10 MG tablet TAKE (1) TABLET BY MOUTH ONCE DAILY. 13 tablet 11   No current facility-administered medications for this visit.    No Known Allergies   REVIEW OF SYSTEMS:  [X]  denotes positive finding, [ ]  denotes negative finding Cardiac  Comments:  Chest pain or chest pressure:    Shortness of breath upon exertion:    Short of breath when lying flat:    Irregular heart rhythm:        Vascular    Pain in calf, thigh, or hip brought on by ambulation:    Pain in feet at night that wakes you up from your sleep:     Blood clot in your veins:    Leg swelling:         Pulmonary    Oxygen at home:    Productive cough:     Wheezing:         Neurologic    Sudden weakness in arms or legs:     Sudden numbness in arms or legs:     Sudden onset of difficulty speaking or slurred speech:    Temporary loss of vision in one eye:     Problems with dizziness:         Gastrointestinal    Blood in stool:     Vomited blood:         Genitourinary    Burning when urinating:     Blood in urine:        Psychiatric    Major depression:         Hematologic    Bleeding problems:    Problems with blood clotting too easily:        Skin    Rashes or ulcers:        Constitutional    Fever or chills:      PHYSICAL EXAMINATION:  There were no vitals filed for this visit.  General:  WDWN in NAD; vital signs documented above Gait: Not observed HENT: WNL, normocephalic Pulmonary: normal non-labored breathing , without wheezing Cardiac: regular HR Abdomen: soft, NT, no masses Skin:  without rashes Vascular Exam/Pulses:  Right Left  Radial 2+ (normal)  2+ (normal)  Ulnar    Femoral    Popliteal    DP 2+ (normal) 2+ (normal)  PT     Extremities: without ischemic changes, without Gangrene , without cellulitis; without open wounds;  Musculoskeletal: no muscle wasting or atrophy  Neurologic: A&O X 3;  No focal weakness or paresthesias are detected Psychiatric:  The pt has Normal affect.   Non-Invasive Vascular Imaging:    IMPRESSION:   Slight interval decrease in size of the infrarenal abdominal aortic aneurysm status post interval endovascular repair with no visualized endoleak.    ASSESSMENT/PLAN: Sean Lowe is a 68 y.o. male presenting status post 02/13/2024 EVAR for asymptomatic 6cm infrarenal abdominal aneurysm.  Postoperative CT angiogram abdomen pelvis was reviewed.  The aneurysm is smaller in size.  No endoleak appreciated.  I am very happy with this result.  My plan is to see him in 1 years time with repeat arterial duplex ultrasound to ensure no change in aneurysm morphology.  I asked him to continue his current medication regimen and call me if questions or concerns arise.   Kayla Part, MD [facility] [phone]

## 2024-03-28 ENCOUNTER — Ambulatory Visit
Admission: RE | Admit: 2024-03-28 | Discharge: 2024-03-28 | Disposition: A | Discharge status: Home / Self Care | Source: Ambulatory Visit | Attending: Vascular Surgery

## 2024-03-28 DIAGNOSIS — I714 Abdominal aortic aneurysm, without rupture, unspecified: Secondary | ICD-10-CM

## 2024-03-28 MED ORDER — IOPAMIDOL (ISOVUE-370) INJECTION 76%
500.0000 mL | Freq: Once | INTRAVENOUS | Status: AC | PRN
  Administered 2024-03-28: 75 mL via INTRAVENOUS

## 2024-03-29 ENCOUNTER — Ambulatory Visit: Attending: Vascular Surgery | Admitting: Vascular Surgery

## 2024-03-29 ENCOUNTER — Encounter: Payer: Self-pay | Admitting: Vascular Surgery

## 2024-03-29 VITALS — BP 107/67 | HR 77 | Temp 98.3°F | Resp 18 | Ht 70.0 in | Wt 143.4 lb

## 2024-03-29 DIAGNOSIS — Z9889 Other specified postprocedural states: Secondary | ICD-10-CM

## 2024-03-29 DIAGNOSIS — Z8679 Personal history of other diseases of the circulatory system: Secondary | ICD-10-CM

## 2024-04-24 NOTE — Progress Notes (Unsigned)
  Cardiology Office Note:  .   Date:  04/24/2024  ID:  Sean Lowe, DOB 1956-01-27, MRN [id number] PCP: Charle Congo, MD  [facility] Providers Cardiologist:  Knox Perl, MD {   History of Present Illness: .   Sean Lowe is a 68 y.o. male with history of abdominal aortic aneurysm status post EVAR 01/2024, orthostatic hypotension, schizophrenia, nicotine dependence 1 PPD.  Patient last seen March 2025 for preoperative valuation for asymptomatic 6 cm infrarenal abdominal aneurysm with plans of EVAR.  At this visit he had reported recurrent episodes of syncope that was positional when standing up, felt related to orthostatic hypotension.  Recommended liberal salt intake in support stockings.  Newly prescribed fludrocortisone  0.1 mg in the evening.  Today he is here to follow-up specifically on his orthostatic hypotension.  Orthostatic hypotension  Nicotine dependence  ROS: Denies: Chest pain, shortness of breath, orthopnea, peripheral edema, palpitations, decreased exercise intolerance, fatigue, lightheadedness.   Studies Reviewed: .         Risk Assessment/Calculations:   {Does this patient have ATRIAL FIBRILLATION?:[phone]} No BP recorded.  {Refresh Note OR Click here to enter BP  :1}***       Physical Exam:   VS:  There were no vitals taken for this visit.   Wt Readings from Last 3 Encounters:  03/29/24 143 lb 6.4 oz (65 kg)  02/13/24 140 lb (63.5 kg)  02/09/24 147 lb 3.2 oz (66.8 kg)    GEN: Well nourished, well developed in no acute distress NECK: No JVD; No carotid bruits CARDIAC: ***RRR, no murmurs, rubs, gallops RESPIRATORY:  Clear to auscultation without rales, wheezing or rhonchi  ABDOMEN: Soft, non-tender, non-distended EXTREMITIES:  No edema; No deformity   ASSESSMENT AND PLAN: .         {Are you ordering a CV Procedure (e.g. stress test, cath, DCCV, TEE, etc)?   Press F2        :[id number]}  Dispo: ***  Signed, Burnetta Cart, PA-C

## 2024-04-25 ENCOUNTER — Ambulatory Visit: Attending: Physician Assistant | Admitting: Physician Assistant

## 2024-04-25 ENCOUNTER — Encounter: Payer: Self-pay | Admitting: Physician Assistant

## 2024-04-25 VITALS — BP 118/64 | HR 76 | Ht 70.0 in | Wt 149.8 lb

## 2024-04-25 DIAGNOSIS — I951 Orthostatic hypotension: Secondary | ICD-10-CM

## 2024-04-25 DIAGNOSIS — Z720 Tobacco use: Secondary | ICD-10-CM

## 2024-04-25 DIAGNOSIS — I7143 Infrarenal abdominal aortic aneurysm, without rupture: Secondary | ICD-10-CM

## 2024-04-25 NOTE — Patient Instructions (Signed)
 Medication Instructions:  No changes. You can get refills for Fludrocortisone  with your primary care provider.  *If you need a refill on your cardiac medications before your next appointment, please call your pharmacy*  Lab Work: Today - BMET If you have labs (blood work) drawn today and your tests are completely normal, you will receive your results only by: MyChart Message (if you have MyChart) OR A paper copy in the mail If you have any lab test that is abnormal or we need to change your treatment, we will call you to review the results.   Follow-Up: At [facility], you and your health needs are our priority.  As part of our continuing mission to provide you with exceptional heart care, our providers are all part of one team.  This team includes your primary Cardiologist (physician) and Advanced Practice Providers or APPs (Physician Assistants and Nurse Practitioners) who all work together to provide you with the care you need, when you need it.  Your next appointment:   As needed   Provider:   Knox Perl, MD    We recommend signing up for the patient portal called "MyChart".  Sign up information is provided on this After Visit Summary.  MyChart is used to connect with patients for Virtual Visits (Telemedicine).  Patients are able to view lab/test results, encounter notes, upcoming appointments, etc.  Non-urgent messages can be sent to your provider as well.   To learn more about what you can do with MyChart, go to [email or web address].   Other Instructions

## 2024-04-25 NOTE — Assessment & Plan Note (Signed)
Follow-up with vascular surgery as planned.

## 2024-04-26 ENCOUNTER — Ambulatory Visit: Payer: Self-pay | Admitting: Physician Assistant

## 2024-04-26 LAB — BASIC METABOLIC PANEL WITH GFR
BUN/Creatinine Ratio: 14 (ref 10–24)
BUN: 17 mg/dL (ref 8–27)
CO2: 24 mmol/L (ref 20–29)
Calcium: 9.2 mg/dL (ref 8.6–10.2)
Chloride: 102 mmol/L (ref 96–106)
Creatinine, Ser: 1.2 mg/dL (ref 0.76–1.27)
Glucose: 80 mg/dL (ref 70–99)
Potassium: 4.7 mmol/L (ref 3.5–5.2)
Sodium: 142 mmol/L (ref 134–144)
eGFR: 65 mL/min/{1.73_m2} (ref >59)
